# Patient Record
Sex: Male | Born: 1995 | Race: White | Hispanic: No | Marital: Married | Smoking: Former smoker
Health system: Southern US, Community
[De-identification: ages and names within clinical notes are randomized; demographics above are authoritative.]

## PROBLEM LIST (undated history)

## (undated) DIAGNOSIS — F419 Anxiety disorder, unspecified: Secondary | ICD-10-CM

## (undated) DIAGNOSIS — G43909 Migraine, unspecified, not intractable, without status migrainosus: Secondary | ICD-10-CM

## (undated) HISTORY — DX: Migraine, unspecified, not intractable, without status migrainosus: G43.909

## (undated) HISTORY — PX: APPENDECTOMY: SHX54

## (undated) HISTORY — DX: Anxiety disorder, unspecified: F41.9

---
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
## 2013-02-16 ENCOUNTER — Telehealth: Payer: Self-pay | Admitting: Internal Medicine

## 2013-02-16 NOTE — Telephone Encounter (Signed)
Yes can use acute

## 2013-02-16 NOTE — Telephone Encounter (Signed)
Schedule any time 12/31

## 2013-02-16 NOTE — Telephone Encounter (Signed)
The patient has been scheduled on 12.31.14.

## 2013-02-16 NOTE — Telephone Encounter (Signed)
Only spots available before patient returns to school is 03/04/13 please advise if okay to schedule patient as acute.

## 2013-02-16 NOTE — Telephone Encounter (Signed)
The patient is needing to be seen while he is out for the Christmas break. He is having a lot of stress and sleep issues . Please advise as to where to put the patient . He will return to school on 1.2.14.

## 2013-03-02 ENCOUNTER — Emergency Department: Payer: Self-pay | Admitting: Emergency Medicine

## 2013-03-02 LAB — COMPREHENSIVE METABOLIC PANEL
Alkaline Phosphatase: 94 U/L
Bilirubin,Total: 0.3 mg/dL (ref 0.2–1.0)
Calcium, Total: 9 mg/dL (ref 9.0–10.7)
Chloride: 104 mmol/L (ref 98–107)
Creatinine: 0.87 mg/dL (ref 0.60–1.30)
EGFR (African American): >60
EGFR (Non-African Amer.): >60
Glucose: 157 mg/dL — ABNORMAL HIGH (ref 65–99)
SGOT(AST): 16 U/L (ref 10–41)
SGPT (ALT): 26 U/L (ref 12–78)

## 2013-03-02 LAB — URINALYSIS, COMPLETE
Bilirubin,UR: NEGATIVE
Blood: NEGATIVE
Glucose,UR: NEGATIVE mg/dL (ref 0–75)
Ketone: NEGATIVE
Nitrite: NEGATIVE
Ph: 5 (ref 4.5–8.0)
RBC,UR: 1 /HPF (ref 0–5)
Specific Gravity: 1.028 (ref 1.003–1.030)
WBC UR: 1 /HPF (ref 0–5)

## 2013-03-02 LAB — CBC WITH DIFFERENTIAL/PLATELET
Basophil %: 0.4 %
Eosinophil #: 0 10*3/uL (ref 0.0–0.7)
Eosinophil %: 0.6 %
HCT: 46.2 % (ref 40.0–52.0)
HGB: 15.6 g/dL (ref 13.0–18.0)
MCH: 30 pg (ref 26.0–34.0)
MCHC: 33.9 g/dL (ref 32.0–36.0)
MCV: 89 fL (ref 80–100)
Monocyte #: 0.4 x10 3/mm (ref 0.2–1.0)
Monocyte %: 5.7 %
RBC: 5.21 10*6/uL (ref 4.40–5.90)
WBC: 6.7 10*3/uL (ref 3.8–10.6)

## 2013-03-02 LAB — LIPASE, BLOOD: Lipase: 93 U/L (ref 73–393)

## 2013-03-04 ENCOUNTER — Ambulatory Visit (INDEPENDENT_AMBULATORY_CARE_PROVIDER_SITE_OTHER): Payer: BC Managed Care – PPO | Admitting: Internal Medicine

## 2013-03-04 ENCOUNTER — Encounter: Payer: Self-pay | Admitting: Internal Medicine

## 2013-03-04 VITALS — BP 120/70 | HR 82 | Temp 97.9°F | Resp 12 | Wt 155.0 lb

## 2013-03-04 DIAGNOSIS — F411 Generalized anxiety disorder: Secondary | ICD-10-CM

## 2013-03-04 DIAGNOSIS — K5909 Other constipation: Secondary | ICD-10-CM

## 2013-03-04 DIAGNOSIS — K5903 Drug induced constipation: Secondary | ICD-10-CM

## 2013-03-04 DIAGNOSIS — T50904A Poisoning by unspecified drugs, medicaments and biological substances, undetermined, initial encounter: Secondary | ICD-10-CM

## 2013-03-04 DIAGNOSIS — F419 Anxiety disorder, unspecified: Secondary | ICD-10-CM

## 2013-03-04 MED ORDER — BUSPIRONE HCL 7.5 MG PO TABS: 7.5000 mg | ORAL_TABLET | Freq: Three times a day (TID) | ORAL | Status: DC

## 2013-03-04 NOTE — Patient Instructions (Signed)
I am prescribing buspirone for your anxiety.  It is taken up to 3 times,  Daily every 8 hours.  We can increase the dose after a week if you feel it is not helping   Stop the lactulose and try the linzess samples once daily if needed for constipation  The elavil and the sleep aid are probably causing the constipation   I recommend that you have the school rrefer you for a counselor to help manage your anxiety

## 2013-03-04 NOTE — Progress Notes (Signed)
Pre visit review using our clinic review tool, if applicable. No additional management support is needed unless otherwise documented below in the visit note. 

## 2013-03-04 NOTE — Progress Notes (Signed)
Patient ID: Ryan Woods, male   DOB: 09-24-1995, 17 y.o.   MRN: [id number]    Patient Active Problem List   Diagnosis Date Noted  . Drug-induced constipation 03/05/2013  . Routine general medical examination at a health care facility 08/09/2012  . Migraine 04/07/2012  . Need for prophylactic vaccination with combined diphtheria-tetanus-pertussis (DTP) vaccine 04/07/2012  . Need for prophylactic vaccination against hepatitis A 04/07/2012  . Back pain, lumbosacral 04/07/2012  . Anxiety 04/07/2012    Subjective:  CC:   Chief Complaint  Patient presents with  . Anxiety    pt states cant sleep wakes average 6 times, falls asleep around 1 AM then wakes up usually around 10 AM / bad anxiety the last month due to  finance / school. pt has loss of apeptite, denies suicidal thoughts or hurting others.     HPI:   Ryan Woods a 17 y.o. male who presents with Uncontrolled anxiety and worsening insomnia. He is accompanied by his mother Kenney Houseman.  Having constant anxiety,  Worrying all the time, using an OTC sleep aid every night. Aggravating factors include recent transition from living at home to matriculation to culinary school in [place], mother's declining health.   , Had an epiosde of abdominal pain  Which resulted in ER visit and CT scan notable for only constipation.  Has been taking elavil at bedtime for chronic headache prevention and has been using a sleep aid which contains dipenhydramine.  He has noted that his normally loose bowels have recently become hard and infrequent . In the ER he was given liquid lactulose and bentyl . He has been using lactulose once daily and the bentyl 4 times daily and has been  .   Averaging 2 bowel movements daily.     Past Medical History  Diagnosis Date  . Migraine     History reviewed. No pertinent past surgical history.     The following portions of the patient's history were reviewed and updated as appropriate: Allergies,  current medications, and problem list.    Review of Systems:   12 Pt  review of systems was negative except those addressed in the HPI,     History   Social History  . Marital Status: Single    Spouse Name: N/A    Number of Children: N/A  . Years of Education: N/A   Occupational History  . Not on file.   Social History Main Topics  . Smoking status: Light Tobacco Smoker  . Smokeless tobacco: Never Used  . Alcohol Use: No  . Drug Use: No  . Sexual Activity: No   Other Topics Concern  . Not on file   Social History Narrative  . No narrative on file    Objective:  Filed Vitals:   03/04/13 0850  BP: 120/70  Pulse: 82  Temp: 97.9 F (36.6 C)  Resp: 12     General appearance: alert, cooperative and appears stated age Ears: normal TM's and external ear canals both ears Throat: lips, mucosa, and tongue normal; teeth and gums normal Neck: no adenopathy, no carotid bruit, supple, symmetrical, trachea midline and thyroid not enlarged, symmetric, no tenderness/mass/nodules Back: symmetric, no curvature. ROM normal. No CVA tenderness. Lungs: clear to auscultation bilaterally Heart: regular rate and rhythm, S1, S2 normal, no murmur, click, rub or gallop Abdomen: soft, non-tender; bowel sounds normal; no masses,  no organomegaly Pulses: 2+ and symmetric Skin: Skin color, texture, turgor normal. No rashes or lesions Lymph nodes: Cervical, supraclavicular, and  axillary nodes normal. Psych: anxious, makes good eye contact  Assessment and Plan:  Anxiety trial of buspirone  Low dose, and strong recommendations to start psychotherapy in [place] on campus.   Drug-induced constipation Secondary to use of elavil and benadryl.  Advised to stop the benadryl ,  Trial of linzess,  Samples given.   A total of 30 minutes of face to face time was spent with patient more than half of which was spent in counselling and coordination of care    Updated Medication  List Outpatient Encounter Prescriptions as of 03/04/2013  Medication Sig  . amitriptyline (ELAVIL) 25 MG tablet Take 1 tablet (25 mg total) by mouth at bedtime.  . dicyclomine (BENTYL) 20 MG tablet   . lactulose (CHRONULAC) 10 GM/15ML solution   . busPIRone (BUSPAR) 7.5 MG tablet Take 1 tablet (7.5 mg total) by mouth 3 (three) times daily.     No orders of the defined types were placed in this encounter.    No Follow-up on file.

## 2013-03-05 ENCOUNTER — Encounter: Payer: Self-pay | Admitting: Internal Medicine

## 2013-03-05 DIAGNOSIS — K5903 Drug induced constipation: Secondary | ICD-10-CM | POA: Insufficient documentation

## 2013-03-05 NOTE — Assessment & Plan Note (Signed)
Secondary to use of elavil and benadryl.  Advised to stop the benadryl ,  Trial of linzess,  Samples given.

## 2013-03-05 NOTE — Assessment & Plan Note (Signed)
trial of buspirone  Low dose, and strong recommendations to start psychotherapy in [place] on campus.

## 2013-04-20 ENCOUNTER — Other Ambulatory Visit: Payer: Self-pay | Admitting: Internal Medicine

## 2013-06-08 ENCOUNTER — Telehealth: Payer: Self-pay | Admitting: Internal Medicine

## 2013-06-08 ENCOUNTER — Telehealth: Payer: Self-pay | Admitting: *Deleted

## 2013-06-08 MED ORDER — BUSPIRONE HCL 7.5 MG PO TABS: 7.5000 mg | ORAL_TABLET | Freq: Three times a day (TID) | ORAL | Status: DC

## 2013-06-08 NOTE — Telephone Encounter (Signed)
States [facility] in [place] faxed request for buspar refill several times.  States they have not gotten a response.  Pt is in college in [place] and needs the script sent there.  States he has been out and is having difficulty with anxiety.    [facility] [place] Fax:  [phone]  Phone:  [phone]  States he needs the script immediately.  Asking for call when complete. States to call father, Cordie GriceLeonard Reep [phone] so that he can text him that the script has been called in as pt will be in class.

## 2013-06-08 NOTE — Telephone Encounter (Signed)
Refill sent as requested. 

## 2013-09-30 ENCOUNTER — Encounter: Payer: Self-pay | Admitting: Internal Medicine

## 2013-09-30 ENCOUNTER — Ambulatory Visit (INDEPENDENT_AMBULATORY_CARE_PROVIDER_SITE_OTHER): Payer: BC Managed Care – PPO | Admitting: Internal Medicine

## 2013-09-30 VITALS — BP 130/78 | HR 68 | Temp 98.5°F | Resp 16 | Ht 71.0 in | Wt 151.5 lb

## 2013-09-30 DIAGNOSIS — F411 Generalized anxiety disorder: Secondary | ICD-10-CM

## 2013-09-30 DIAGNOSIS — F419 Anxiety disorder, unspecified: Secondary | ICD-10-CM

## 2013-09-30 MED ORDER — SERTRALINE HCL 50 MG PO TABS: 50.0000 mg | ORAL_TABLET | Freq: Every day | ORAL | Status: DC

## 2013-09-30 MED ORDER — BUSPIRONE HCL 7.5 MG PO TABS: 7.5000 mg | ORAL_TABLET | Freq: Three times a day (TID) | ORAL | Status: DC

## 2013-09-30 NOTE — Patient Instructions (Signed)
We are Starting generic zoloft at 1/2 tablet daily with a  meal for the  first 4 to 6 days,   Increase to full tablet when tolerating 1/2 tablet   After two weeks of 50 mg daily,  If still anxious increase to 100 mg daily  E mail me at that point  [facility]  As needed for anxiety

## 2013-09-30 NOTE — Progress Notes (Signed)
Pre-visit discussion using our clinic review tool. No additional management support is needed unless otherwise documented below in the visit note.  

## 2013-10-02 NOTE — Assessment & Plan Note (Signed)
Adding generic zoloft.  Dose ot be titrated up prior to returning to school in september. continue buspirone

## 2013-10-02 NOTE — Progress Notes (Signed)
Patient ID: Ryan Woods, male   DOB: 07-Jan-1996, 18 y.o.   MRN: [id number]   Patient Active Problem List   Diagnosis Date Noted  . Drug-induced constipation 03/05/2013  . Routine general medical examination at a health care facility 08/09/2012  . Migraine 04/07/2012  . Need for prophylactic vaccination with combined diphtheria-tetanus-pertussis (DTP) vaccine 04/07/2012  . Need for prophylactic vaccination against hepatitis A 04/07/2012  . Back pain, lumbosacral 04/07/2012  . Anxiety 04/07/2012    Subjective:  CC:   Chief Complaint  Patient presents with  . Follow-up    to discuss medications    HPI:   Ryan GilmoreChristopher Polson is a 18 y.o. male who presents for Increased anxiety.  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  No suicidality or destrive behaviors.  Self described compulsive personality   Past Medical History  Diagnosis Date  . Migraine     No past surgical history on file.     The following portions of the patient's history were reviewed and updated as appropriate: Allergies, current medications, and problem list.    Review of Systems:   Patient denies headache, fevers, malaise, unintentional weight loss, skin rash, eye pain, sinus congestion and sinus pain, sore throat, dysphagia,  hemoptysis , cough, dyspnea, wheezing, chest pain, palpitations, orthopnea, edema, abdominal pain, nausea, melena, diarrhea, constipation, flank pain, dysuria, hematuria, urinary  Frequency, nocturia, numbness, tingling, seizures,  Focal weakness, Loss of consciousness,  Tremor, insomnia, depression, anxiety, and suicidal ideation.     History   Social History  . Marital Status: Single    Spouse Name: N/A    Number of Children: N/A  . Years of Education: N/A   Occupational History  .  Not on file.   Social History Main Topics  . Smoking status: Light Tobacco Smoker  . Smokeless tobacco: Never Used  . Alcohol Use: No  . Drug Use: No  . Sexual Activity: No   Other Topics Concern  . Not on file   Social History Narrative  . No narrative on file    Objective:  Filed Vitals:   09/30/13 1135  BP: 130/78  Pulse: 68  Temp: 98.5 F (36.9 C)  Resp: 16     General appearance: alert, cooperative and appears stated age Ears: normal TM's and external ear canals both ears Throat: lips, mucosa, and tongue normal; teeth and gums normal Neck: no adenopathy, no carotid bruit, supple, symmetrical, trachea midline and thyroid not enlarged, symmetric, no tenderness/mass/nodules Back: symmetric, no curvature. ROM normal. No CVA tenderness. Lungs: clear to auscultation bilaterally Heart: regular rate and rhythm, S1, S2 normal, no murmur, click, rub or gallop Abdomen: soft, non-tender; bowel sounds normal; no masses,  no organomegaly Pulses: 2+ and symmetric Skin: Skin color, texture, turgor normal. No rashes or lesions Lymph nodes: Cervical, supraclavicular, and axillary nodes normal.  Assessment and Plan:  Anxiety Adding generic zoloft.  Dose ot be titrated up prior to returning to school in september. continue buspirone   A total of 25 minutes of face to face time was spent with patient more than half of which was spent in counselling and coordination of care   Updated Medication List Outpatient Encounter Prescriptions as of 09/30/2013  Medication Sig  . busPIRone (BUSPAR) 7.5 MG tablet Take 1 tablet (7.5 mg total)  by mouth 3 (three) times daily.  . [DISCONTINUED] busPIRone (BUSPAR) 7.5 MG tablet Take 1 tablet (7.5 mg total) by mouth 3 (three) times daily.  Marland Kitchen amitriptyline (ELAVIL) 25 MG tablet TAKE 1 TABLET BY MOUTH AT BEDTIME (NEED UPDATED INSURANCE)  . dicyclomine (BENTYL) 20 MG tablet   . lactulose (CHRONULAC) 10 GM/15ML solution   . sertraline (ZOLOFT) 50 MG  tablet Take 1 tablet (50 mg total) by mouth daily.     No orders of the defined types were placed in this encounter.    Return in about 3 months (around 12/31/2013).

## 2014-01-23 ENCOUNTER — Other Ambulatory Visit: Payer: Self-pay | Admitting: Internal Medicine

## 2014-02-24 ENCOUNTER — Other Ambulatory Visit: Payer: Self-pay | Admitting: Internal Medicine

## 2014-02-24 NOTE — Telephone Encounter (Signed)
Okay to refill? Last seen in July 2015

## 2014-03-01 NOTE — Telephone Encounter (Signed)
Refill one 30 days and offer 6 month follow up on anxiety

## 2014-03-02 NOTE — Telephone Encounter (Signed)
Tried to reach patient by phone no answer or voicemail. Mailed letter to patient to schedule follow up appointment.

## 2014-08-10 IMAGING — CT CT ABD-PELV W/ CM
2 of 4 series · 16 of 46 positions shown, 18 images · IV contrast (agent unspecified)
Comparison: None.

CLINICAL DATA: Left lower quadrant pain, chest pain, and right
lower quadrant pain.

EXAM:
CT ABDOMEN AND PELVIS WITH CONTRAST
TECHNIQUE: Multidetector CT imaging of the abdomen and pelvis was performed
using the standard protocol following bolus administration of
intravenous contrast.
CONTRAST:  100 mL Fsovue-AUU

[Series 2: routine abd pel with · axial · 0.68mm/px · z∈[-492,-82]mm · 13 of 90 slices shown, 15 images]
[im 4/90  soft-tissue]
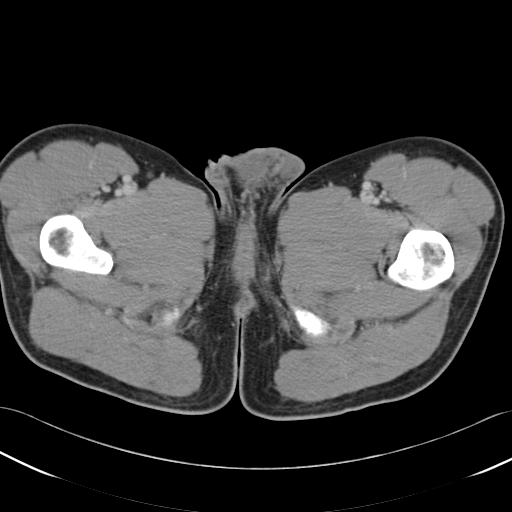
[im 4/90  bone]
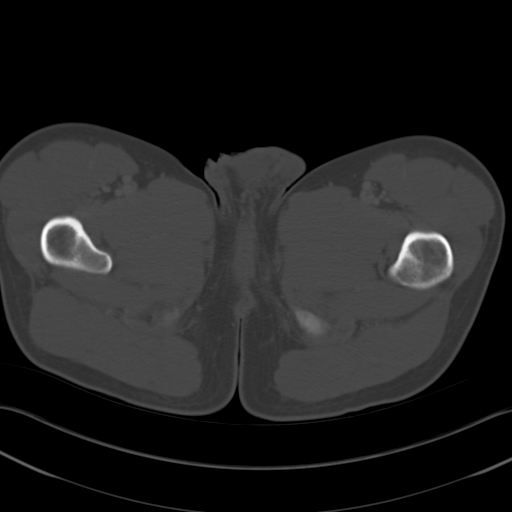
[im 12/90  soft-tissue]
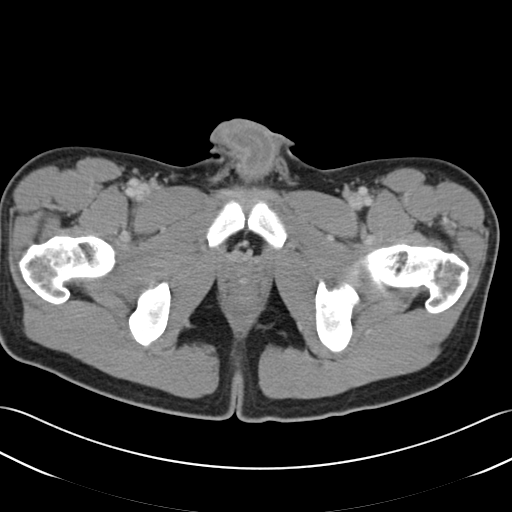
[im 19/90  soft-tissue]
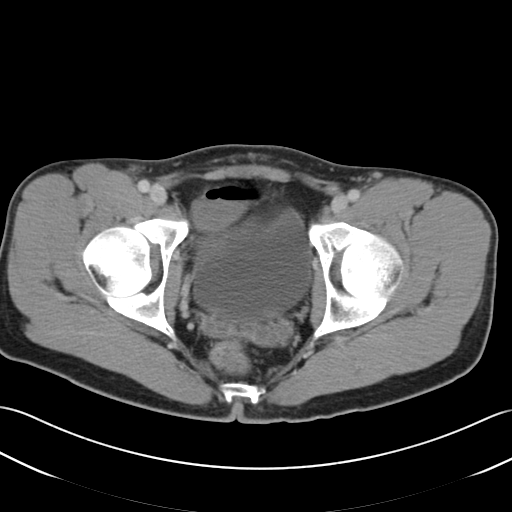
[im 26/90  soft-tissue]
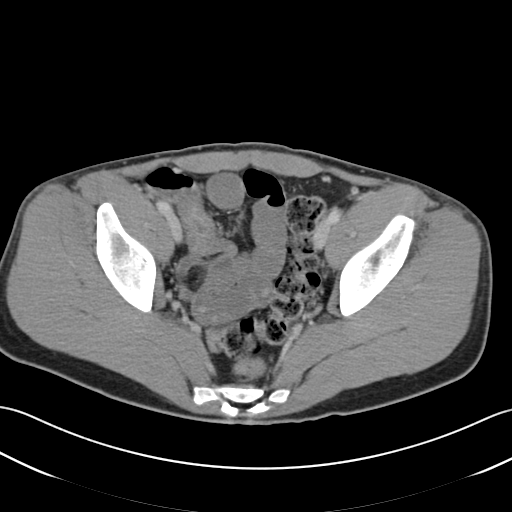
[im 30/90  soft-tissue]
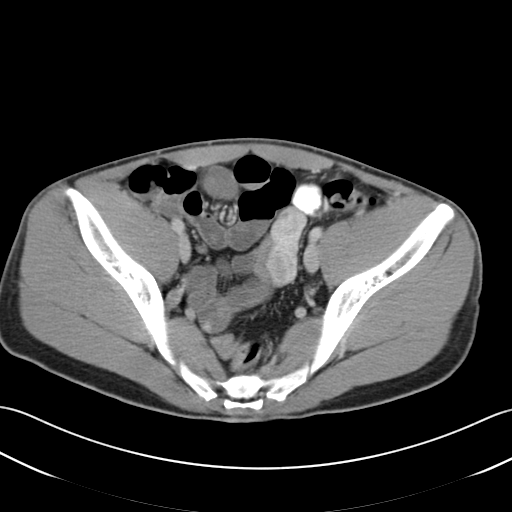
[im 38/90  soft-tissue]
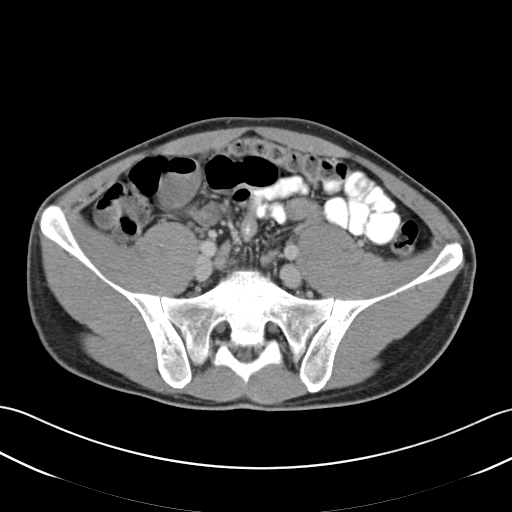
[im 45/90  soft-tissue]
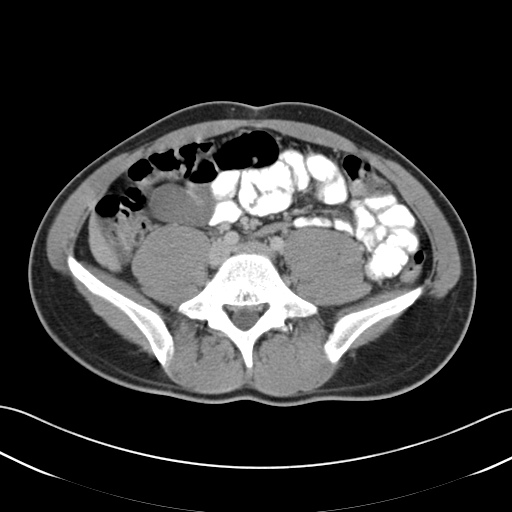
[im 52/90  soft-tissue]
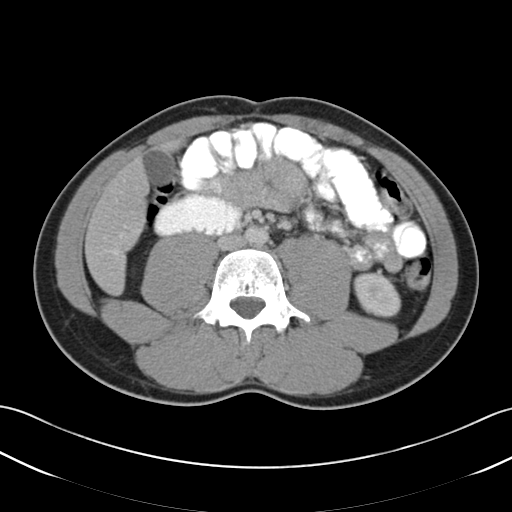
[im 60/90  soft-tissue]
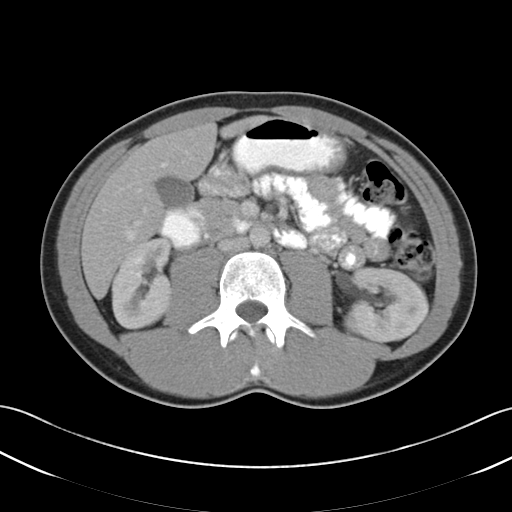
[im 60/90  bone]
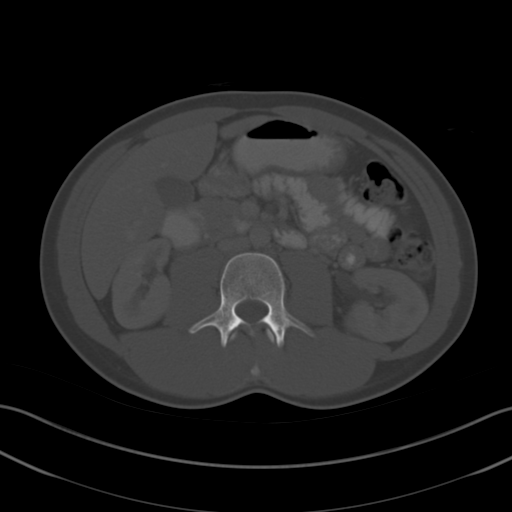
[im 64/90  soft-tissue]
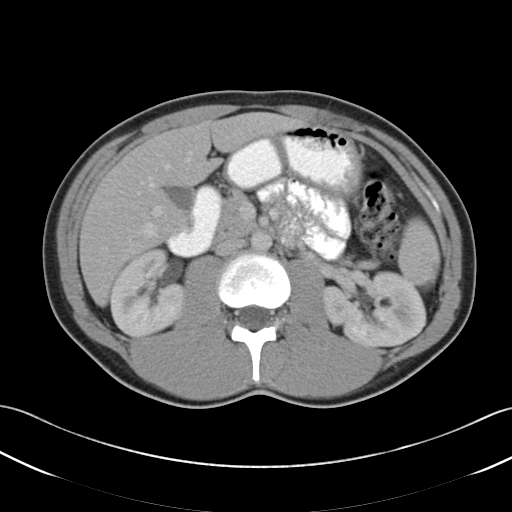
[im 71/90  soft-tissue]
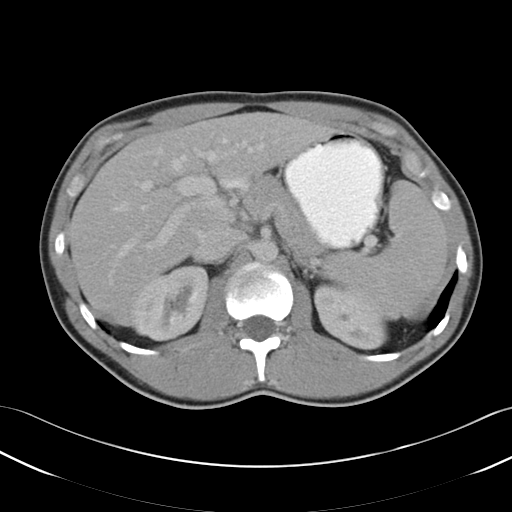
[im 78/90  soft-tissue]
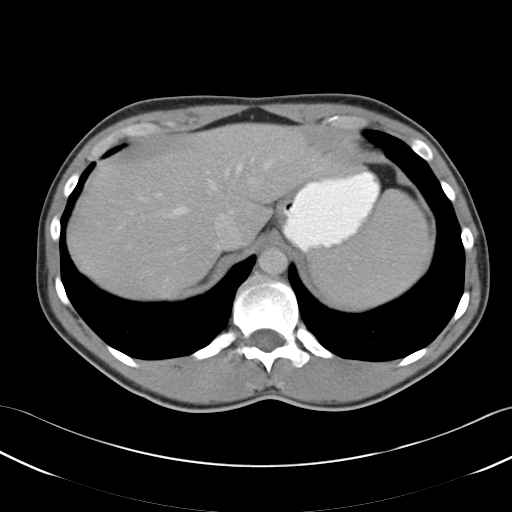
[im 86/90  soft-tissue]
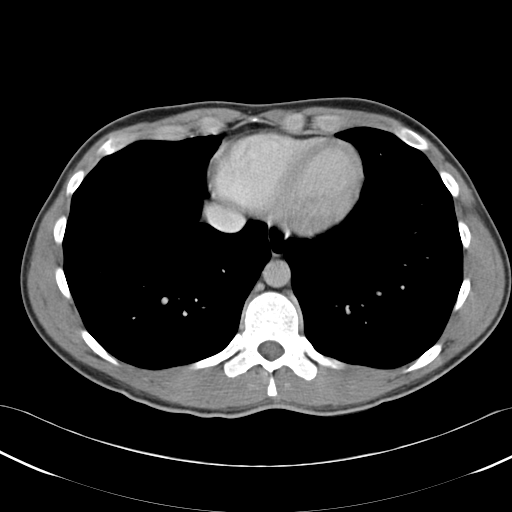

[Series 5: cor routine abd pel with · coronal · 0.61mm/px · 3 of 100 slices shown]
[im 34/100  soft-tissue]
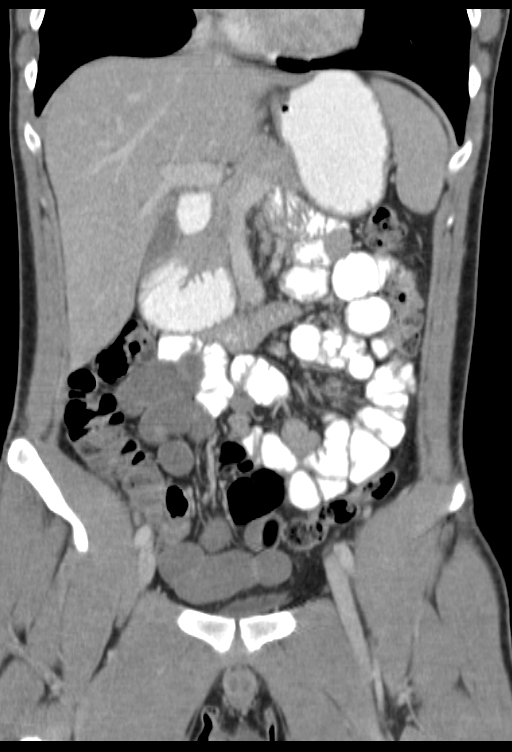
[im 45/100  soft-tissue]
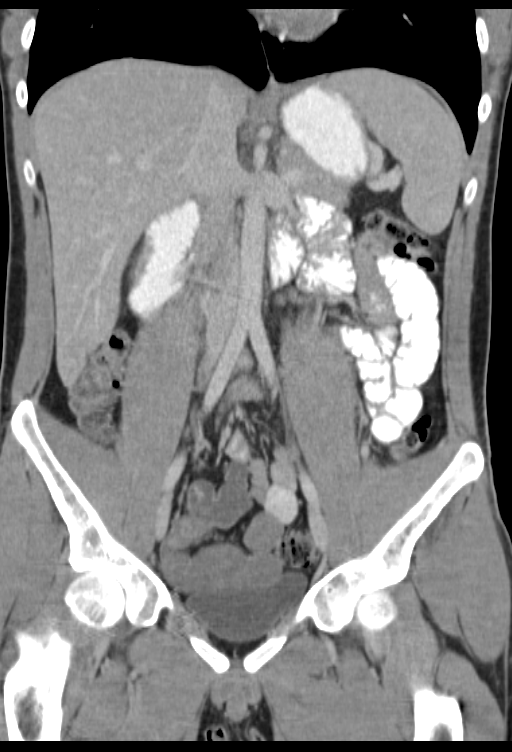
[im 56/100  soft-tissue]
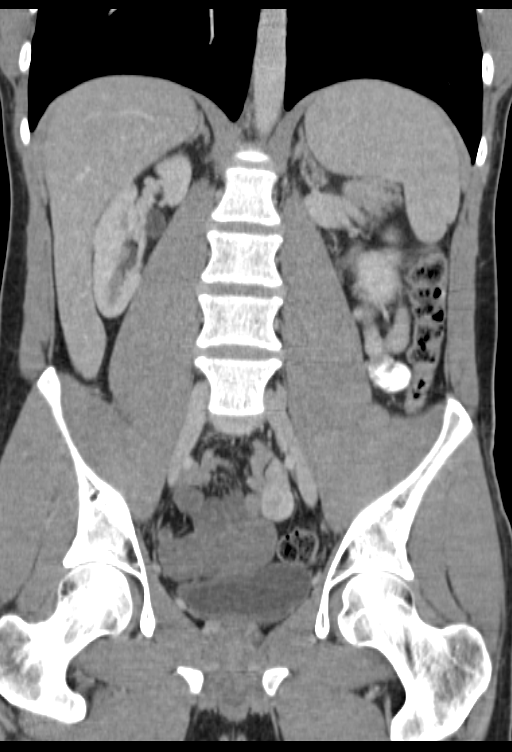

[16 of 46 positions shown; findings below may reference images not displayed]

FINDINGS: Lung bases are clear.

The liver, spleen, gallbladder, pancreas, adrenal glands, kidneys,
abdominal aorta, inferior vena cava, and retroperitoneal lymph nodes
are unremarkable. The stomach, small bowel, and colon are not
abnormally distended. No free air or free fluid in the abdomen.

Pelvis: Prostate gland is not enlarged. Bladder wall is not
thickened. Stool-filled rectosigmoid colon without diverticulitis.
The appendix is normal. No free or loculated pelvic fluid
collections. No destructive bone lesions.
IMPRESSION: No acute process demonstrated in the abdomen or pelvis. The appendix
appears normal.

## 2018-04-11 ENCOUNTER — Other Ambulatory Visit: Payer: Self-pay

## 2018-04-11 ENCOUNTER — Ambulatory Visit
Admission: EM | Admit: 2018-04-11 | Discharge: 2018-04-11 | Disposition: A | Discharge status: Home / Self Care | Payer: BLUE CROSS/BLUE SHIELD | Attending: Emergency Medicine | Admitting: Emergency Medicine

## 2018-04-11 ENCOUNTER — Ambulatory Visit (INDEPENDENT_AMBULATORY_CARE_PROVIDER_SITE_OTHER): Payer: BLUE CROSS/BLUE SHIELD

## 2018-04-11 DIAGNOSIS — M791 Myalgia, unspecified site: Secondary | ICD-10-CM

## 2018-04-11 DIAGNOSIS — R509 Fever, unspecified: Secondary | ICD-10-CM

## 2018-04-11 DIAGNOSIS — R0981 Nasal congestion: Secondary | ICD-10-CM

## 2018-04-11 DIAGNOSIS — R69 Illness, unspecified: Secondary | ICD-10-CM

## 2018-04-11 DIAGNOSIS — R05 Cough: Secondary | ICD-10-CM | POA: Diagnosis not present

## 2018-04-11 DIAGNOSIS — J111 Influenza due to unidentified influenza virus with other respiratory manifestations: Secondary | ICD-10-CM

## 2018-04-11 DIAGNOSIS — Z87891 Personal history of nicotine dependence: Secondary | ICD-10-CM

## 2018-04-11 MED ORDER — HYDROCOD POLST-CPM POLST ER 10-8 MG/5ML PO SUER: 5.0000 mL | Freq: Every evening | ORAL | 0 refills | Status: DC | PRN

## 2018-04-11 MED ORDER — ACETAMINOPHEN 500 MG PO TABS
1000.0000 mg | ORAL_TABLET | Freq: Once | ORAL | Status: AC
  Administered 2018-04-11: 1000 mg via ORAL

## 2018-04-11 MED ORDER — BENZONATATE 100 MG PO CAPS: 100.0000 mg | ORAL_CAPSULE | Freq: Three times a day (TID) | ORAL | 0 refills | Status: DC | PRN

## 2018-04-11 NOTE — ED Provider Notes (Signed)
[facility] ____________________________________________  Time seen: Approximately 9:50 AM  I have reviewed the triage vital signs and the nursing notes.   HISTORY  Chief Complaint Generalized Body Aches   HPI Ryan Woods is a 23 y.o. male presenting for evaluation of cough, congestion, chills and body aches with accompanying fever since Tuesday morning.  States has been taken over-the-counter cough and congestion medication that had Tylenol in it.  States has taken occasional ibuprofen in addition.  States feels tired and fatigued.  Reports quick onset of symptoms.  Denies known direct sick contacts.  Does work at [employer] around a lot of people.  Has not taken any over-the-counter medications for the same complaints.  Denies chest pain abdominal pain.  Does report some shortness of breath.  States cough is occasionally productive.  Cough causes headache.  Cough disrupting sleep.  Overall continues to drink fluids well.  Denies other relieving factors.    Sherlene Shams, MD: PCP    Past Medical History:  Diagnosis Date  . Migraine     Patient Active Problem List   Diagnosis Date Noted  . Drug-induced constipation 03/05/2013  . Routine general medical examination at a health care facility 08/09/2012  . Migraine 04/07/2012  . Need for prophylactic vaccination with combined diphtheria-tetanus-pertussis (DTP) vaccine 04/07/2012  . Need for prophylactic vaccination against hepatitis A 04/07/2012  . Back pain, lumbosacral 04/07/2012  . Anxiety 04/07/2012    Past Surgical History:  Procedure Laterality Date  . APPENDECTOMY       No current facility-administered medications for this encounter.   Current Outpatient Medications:  .  benzonatate (TESSALON PERLES) 100 MG capsule, Take 1 capsule (100 mg total) by mouth 3 (three) times daily as needed for cough., Disp: 15 capsule, Rfl: 0 .  chlorpheniramine-HYDROcodone (TUSSIONEX PENNKINETIC ER) 10-8 MG/5ML  SUER, Take 5 mLs by mouth at bedtime as needed for cough. do not drive or operate machinery while taking as can cause drowsiness., Disp: 50 mL, Rfl: 0  Allergies Patient has no known allergies.  Family History  Problem Relation Age of Onset  . Heart disease Mother   . Hyperlipidemia Father   . Hypertension Father   . Arthritis Father   . Thyroid disease Father   . Cancer Maternal Grandmother        throat  . Heart disease Maternal Grandfather   . Thyroid disease Paternal Grandmother   . Heart disease Paternal Grandfather     Social History Social History   Tobacco Use  . Smoking status: Former Games developer  . Smokeless tobacco: Never Used  Substance Use Topics  . Alcohol use: Yes    Comment: occasionally  . Drug use: No    Review of Systems Constitutional: Positive fevers ENT: Some sore throat.  Denies current sore throat.  Positive nasal congestion. Cardiovascular: Denies chest pain. Respiratory: Denies shortness of breath. Gastrointestinal: No abdominal pain.  No nausea, no vomiting.  No diarrhea.   Musculoskeletal: Generalized body aches. Skin: Negative for rash. Neurological: Negative for focal weakness or numbness.  ____________________________________________   PHYSICAL EXAM:  VITAL SIGNS: ED Triage Vitals  Enc Vitals Group     BP 04/11/18 0905 128/82     Pulse Rate 04/11/18 0905 (!) 112     Resp 04/11/18 0905 18     Temp 04/11/18 0905 (!) 100.9 F (38.3 C)     Temp Source 04/11/18 0905 Oral     SpO2 04/11/18 0905 100 %  Weight 04/11/18 0901 155 lb (70.3 kg)     Height 04/11/18 0901 6' (1.829 m)     Head Circumference --      Peak Flow --      Pain Score 04/11/18 0901 9     Pain Loc --      Pain Edu? --      Excl. in GC? --    Constitutional: Alert and oriented. Well appearing and in no acute distress. Eyes: Conjunctivae are normal.  Head: Atraumatic. No sinus tenderness to palpation. No swelling. No erythema.  Ears: no erythema, normal TMs  bilaterally.   Nose:Nasal congestion   Mouth/Throat: Mucous membranes are moist. No pharyngeal erythema. No tonsillar swelling or exudate.  Neck: No stridor.  No cervical spine tenderness to palpation. Hematological/Lymphatic/Immunilogical: No cervical lymphadenopathy. Cardiovascular: Normal rate, regular rhythm. Grossly normal heart sounds.  Good peripheral circulation. Respiratory: Normal respiratory effort.  No retractions. No wheezes.  Mild scattered rhonchi. Good air movement.  Musculoskeletal: Ambulatory with steady gait. No cervical, thoracic or lumbar tenderness to palpation. Neurologic:  Normal speech and language. No gait instability. Skin:  Skin appears warm, dry and intact. No rash noted. Psychiatric: Mood and affect are normal. Speech and behavior are normal. ___________________________________________   LABS (all labs ordered are listed, but only abnormal results are displayed)  Labs Reviewed - No data to display  RADIOLOGY  Dg Chest 2 View  Result Date: 04/11/2018 CLINICAL DATA:  Productive cough and fever for several days, initial encounter EXAM: CHEST - 2 VIEW COMPARISON:  None. FINDINGS: The heart size and mediastinal contours are within normal limits. Both lungs are clear. The visualized skeletal structures are unremarkable. IMPRESSION: No active cardiopulmonary disease. Electronically Signed   By: Alcide Clever M.D.   On: 04/11/2018 09:57   ____________________________________________   PROCEDURES Procedures    INITIAL IMPRESSION / ASSESSMENT AND PLAN / ED COURSE  Pertinent labs & imaging results that were available during my care of the patient were reviewed by me and considered in my medical decision making (see chart for details).  Overall well-appearing patient.  No acute distress.  Suspect influenza.  Is some intermittent shortness of breath and scattered rhonchi, chest x-ray evaluated, no active cardiopulmonary disease as per radiologist above, reviewed by  myself.  Suspect viral illness.  Past timeframe for Tamiflu.  Will treat with PRN Tessalon Perles, PRN Tussionex, over-the-counter Tylenol ibuprofen.  Rest, fluids.  Work note given.Discussed indication, risks and benefits of medications with patient.  1 g Tylenol given once in urgent care.  Discussed follow up with Primary care physician this week. Discussed follow up and return parameters including no resolution or any worsening concerns. Patient verbalized understanding and agreed to plan.   ____________________________________________   FINAL CLINICAL IMPRESSION(S) / ED DIAGNOSES  Final diagnoses:  Influenza-like illness     ED Discharge Orders         Ordered    chlorpheniramine-HYDROcodone (TUSSIONEX PENNKINETIC ER) 10-8 MG/5ML SUER  At bedtime PRN     04/11/18 1004    benzonatate (TESSALON PERLES) 100 MG capsule  3 times daily PRN     04/11/18 1004           Note: This dictation was prepared with Dragon dictation along with smaller phrase technology. Any transcriptional errors that result from this process are unintentional.         Renford Dills, NP 04/11/18 1029

## 2018-04-11 NOTE — ED Triage Notes (Signed)
Patient complains of cough, congestion, body aches, headache and fever that started on Tuesday and seemed to worsen yesterday.

## 2018-04-11 NOTE — Discharge Instructions (Signed)
Take medication as prescribed. Rest. Drink plenty of fluids.  Alternate over-the-counter Tylenol and ibuprofen as discussed. ° °Follow up with your primary care physician this week as needed. Return to Urgent care for new or worsening concerns.  ° °

## 2020-11-04 ENCOUNTER — Other Ambulatory Visit: Payer: Self-pay | Admitting: Obstetrics and Gynecology

## 2020-11-04 DIAGNOSIS — Z319 Encounter for procreative management, unspecified: Secondary | ICD-10-CM

## 2021-08-06 NOTE — Progress Notes (Unsigned)
   New Patient Office Visit  Subjective    Patient ID: Ryan Woods, male    DOB: 1995/04/17  Age: 26 y.o. MRN: [id number]  CC: No chief complaint on file.   HPI Ryan Woods presents for new patient visit to establish care.  Introduced to Designer, jewellery role and practice setting.  All questions answered.  Discussed provider/patient relationship and expectations.   Outpatient Encounter Medications as of 08/07/2021  Medication Sig   benzonatate (TESSALON PERLES) 100 MG capsule Take 1 capsule (100 mg total) by mouth 3 (three) times daily as needed for cough.   chlorpheniramine-HYDROcodone (TUSSIONEX PENNKINETIC ER) 10-8 MG/5ML SUER Take 5 mLs by mouth at bedtime as needed for cough. do not drive or operate machinery while taking as can cause drowsiness.   No facility-administered encounter medications on file as of 08/07/2021.    Past Medical History:  Diagnosis Date   Migraine     Past Surgical History:  Procedure Laterality Date   APPENDECTOMY      Family History  Problem Relation Age of Onset   Heart disease Mother    Hyperlipidemia Father    Hypertension Father    Arthritis Father    Thyroid disease Father    Cancer Maternal Grandmother        throat   Heart disease Maternal Grandfather    Thyroid disease Paternal Grandmother    Heart disease Paternal Grandfather     Social History   Socioeconomic History   Marital status: Single    Spouse name: Not on file   Number of children: Not on file   Years of education: Not on file   Highest education level: Not on file  Occupational History   Not on file  Tobacco Use   Smoking status: Former   Smokeless tobacco: Never  Vaping Use   Vaping Use: Never used  Substance and Sexual Activity   Alcohol use: Yes    Comment: occasionally   Drug use: No   Sexual activity: Never  Other Topics Concern   Not on file  Social History Narrative   Not on file   Social Determinants of Health   Financial  Resource Strain: Not on file  Food Insecurity: Not on file  Transportation Needs: Not on file  Physical Activity: Not on file  Stress: Not on file  Social Connections: Not on file  Intimate Partner Violence: Not on file    ROS      Objective    There were no vitals taken for this visit.  Physical Exam  {Labs (Optional):23779}    Assessment & Plan:   Problem List Items Addressed This Visit   None   No follow-ups on file.   Charyl Dancer, NP

## 2021-08-07 ENCOUNTER — Other Ambulatory Visit: Payer: Self-pay | Admitting: Family Medicine

## 2021-08-07 ENCOUNTER — Ambulatory Visit (INDEPENDENT_AMBULATORY_CARE_PROVIDER_SITE_OTHER): Payer: No Typology Code available for payment source | Admitting: Nurse Practitioner

## 2021-08-07 ENCOUNTER — Encounter: Payer: Self-pay | Admitting: Nurse Practitioner

## 2021-08-07 VITALS — BP 150/80 | HR 77 | Temp 98.6°F | Ht 71.75 in | Wt 182.0 lb

## 2021-08-07 DIAGNOSIS — R7301 Impaired fasting glucose: Secondary | ICD-10-CM | POA: Diagnosis not present

## 2021-08-07 DIAGNOSIS — Z3141 Encounter for fertility testing: Secondary | ICD-10-CM

## 2021-08-07 DIAGNOSIS — Z13220 Encounter for screening for lipoid disorders: Secondary | ICD-10-CM

## 2021-08-07 DIAGNOSIS — I861 Scrotal varices: Secondary | ICD-10-CM

## 2021-08-07 DIAGNOSIS — F419 Anxiety disorder, unspecified: Secondary | ICD-10-CM

## 2021-08-07 DIAGNOSIS — R030 Elevated blood-pressure reading, without diagnosis of hypertension: Secondary | ICD-10-CM

## 2021-08-07 NOTE — Progress Notes (Signed)
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 Needs semen analysis.

## 2021-08-07 NOTE — Assessment & Plan Note (Signed)
He has been checking his blood sugars at home and they range from 60s-140s. Will check A1c today. Discussed eating regularly throughout the day instead of one big meal at night.

## 2021-08-07 NOTE — Assessment & Plan Note (Addendum)
He has a history of anxiety. He was taking zoloft and buspar in the past, but he didn't like the way they made him feel. His PHQ 9 is a 6 and his GAD 7 is an 11. He does not want to start any medications today, however he is interested in therapy. Referral placed for therapy. Will check TSH today.

## 2021-08-07 NOTE — Assessment & Plan Note (Signed)
He has been having left testicular discomfort, aching, and heaviness for the last year and a half. Veins in his left testicle are swollen on exam, most consistent with varicocele. He and his wife has also been having trouble with pregnancy for the last 2 years. Her workup thus far is negative, he has not had any testing done. Will place referral to urology. Discussed using ibuprofen and tylenol as needed for pain. He can also wear supportive underwear.

## 2021-08-07 NOTE — Assessment & Plan Note (Addendum)
Blood pressure elevated today at 150/80. He has not had high blood pressure before. Discussed limiting salt in his diet and increasing exercise. Recommend he start checking his blood pressure a few days a week at home and writing it down. Check CMP, CBC today. Follow-up in 4 weeks.

## 2021-08-07 NOTE — Patient Instructions (Signed)
It was great to see you!  Start checking your blood pressure at home a few days a week and write it down.   I am going to put in a referral to urology and to therapy for you.   Let's follow-up in 4 weeks, sooner if you have concerns.  If a referral was placed today, you will be contacted for an appointment. Please note that routine referrals can sometimes take up to 3-4 weeks to process. Please call our office if you haven't heard anything after this time frame.  Take care,  Rodman Pickle, NP

## 2021-08-08 LAB — COMPREHENSIVE METABOLIC PANEL
ALT: 19 U/L (ref 0–53)
AST: 14 U/L (ref 0–37)
Albumin: 4.8 g/dL (ref 3.5–5.2)
Alkaline Phosphatase: 68 U/L (ref 39–117)
BUN: 17 mg/dL (ref 6–23)
CO2: 26 mEq/L (ref 19–32)
Calcium: 9.5 mg/dL (ref 8.4–10.5)
Chloride: 103 mEq/L (ref 96–112)
Creatinine, Ser: 0.84 mg/dL (ref 0.40–1.50)
GFR: 119.4 mL/min (ref >60.00)
Glucose, Bld: 92 mg/dL (ref 70–99)
Potassium: 4 mEq/L (ref 3.5–5.1)
Sodium: 140 mEq/L (ref 135–145)
Total Bilirubin: 0.3 mg/dL (ref 0.2–1.2)
Total Protein: 7.4 g/dL (ref 6.0–8.3)

## 2021-08-08 LAB — LIPID PANEL
Cholesterol: 142 mg/dL (ref 0–200)
HDL: 47.5 mg/dL (ref >39.00)
LDL Cholesterol: 81 mg/dL (ref 0–99)
NonHDL: 94.64
Total CHOL/HDL Ratio: 3
Triglycerides: 66 mg/dL (ref 0.0–149.0)
VLDL: 13.2 mg/dL (ref 0.0–40.0)

## 2021-08-08 LAB — CBC WITH DIFFERENTIAL/PLATELET
Basophils Absolute: 0 10*3/uL (ref 0.0–0.1)
Basophils Relative: 0.8 % (ref 0.0–3.0)
Eosinophils Absolute: 0 10*3/uL (ref 0.0–0.7)
Eosinophils Relative: 0.5 % (ref 0.0–5.0)
HCT: 43.4 % (ref 39.0–52.0)
Hemoglobin: 14.5 g/dL (ref 13.0–17.0)
Lymphocytes Relative: 24.8 % (ref 12.0–46.0)
Lymphs Abs: 1.3 10*3/uL (ref 0.7–4.0)
MCHC: 33.5 g/dL (ref 30.0–36.0)
MCV: 88.3 fl (ref 78.0–100.0)
Monocytes Absolute: 0.4 10*3/uL (ref 0.1–1.0)
Monocytes Relative: 6.6 % (ref 3.0–12.0)
Neutro Abs: 3.6 10*3/uL (ref 1.4–7.7)
Neutrophils Relative %: 67.3 % (ref 43.0–77.0)
Platelets: 222 10*3/uL (ref 150.0–400.0)
RBC: 4.91 Mil/uL (ref 4.22–5.81)
RDW: 13.1 % (ref 11.5–15.5)
WBC: 5.4 10*3/uL (ref 4.0–10.5)

## 2021-08-08 LAB — TSH: TSH: 3.42 u[IU]/mL (ref 0.35–5.50)

## 2021-08-08 LAB — HEMOGLOBIN A1C: Hgb A1c MFr Bld: 5.5 % (ref 4.6–6.5)

## 2021-08-10 ENCOUNTER — Other Ambulatory Visit: Payer: Self-pay | Admitting: Family Medicine

## 2021-08-11 LAB — SEMEN ANALYSIS, BASIC
Appearance: NORMAL
Concentration, Sperm: 7.7 x10E6/mL — ABNORMAL LOW (ref >14.9)
Immotile Sperm: 40 %
Leukocyte Concentration: <1 x10E6/mL (ref <1.00)
Non-Progressive (NP): 43 %
Normal Morphology-Strict: 4 % (ref >3)
Progressive Motility (PR): 17 % — ABNORMAL LOW (ref >31)
Progressively Motile Sperm: 2.2 x10E6
Time Collected: 1522
Time Received: 1540
Time Since Last Emission: 9 days
Total Motile Sperm: 7.8 x10E6
Total Motility (PR+NP): 60 % (ref >39)
Total Sperm in Ejaculate: 13.1 x10E6 — ABNORMAL LOW (ref >38.9)
Volume: 1.7 mL (ref >1.4)
pH: 8.5 (ref >7.1)

## 2021-08-17 ENCOUNTER — Other Ambulatory Visit: Payer: Self-pay | Admitting: Urology

## 2021-08-17 DIAGNOSIS — N50812 Left testicular pain: Secondary | ICD-10-CM

## 2021-08-23 ENCOUNTER — Ambulatory Visit
Admission: RE | Admit: 2021-08-23 | Discharge: 2021-08-23 | Disposition: A | Discharge status: Home / Self Care | Payer: No Typology Code available for payment source | Source: Ambulatory Visit | Attending: Urology | Admitting: Urology

## 2021-08-23 DIAGNOSIS — N50812 Left testicular pain: Secondary | ICD-10-CM | POA: Diagnosis present

## 2021-09-04 ENCOUNTER — Ambulatory Visit: Payer: No Typology Code available for payment source | Admitting: Nurse Practitioner

## 2021-10-05 ENCOUNTER — Ambulatory Visit: Payer: Self-pay | Admitting: Internal Medicine

## 2022-01-22 ENCOUNTER — Encounter: Payer: Self-pay | Admitting: Nurse Practitioner

## 2022-04-10 NOTE — Progress Notes (Unsigned)
   Established Patient Office Visit  Subjective   Patient ID: Ryan Woods, male    DOB: 1995/10/06  Age: 27 y.o. MRN: [id number]  No chief complaint on file.   HPI  Ryan Woods is here to follow-up on elevated blood pressure.   {History (Optional):23778}  ROS    Objective:     There were no vitals taken for this visit. {Vitals History (Optional):23777}  Physical Exam   No results found for any visits on 04/11/22.  {Labs (Optional):23779}  The ASCVD Risk score (Arnett DK, et al., 2019) failed to calculate for the following reasons:   The 2019 ASCVD risk score is only valid for ages 62 to 60    Assessment & Plan:   Problem List Items Addressed This Visit   None   No follow-ups on file.    Charyl Dancer, NP

## 2022-04-11 ENCOUNTER — Ambulatory Visit (INDEPENDENT_AMBULATORY_CARE_PROVIDER_SITE_OTHER): Payer: No Typology Code available for payment source | Admitting: Nurse Practitioner

## 2022-04-11 ENCOUNTER — Encounter: Payer: Self-pay | Admitting: Nurse Practitioner

## 2022-04-11 VITALS — BP 145/88 | HR 79 | Temp 98.2°F | Wt 182.0 lb

## 2022-04-11 DIAGNOSIS — I10 Essential (primary) hypertension: Secondary | ICD-10-CM | POA: Insufficient documentation

## 2022-04-11 DIAGNOSIS — Z23 Encounter for immunization: Secondary | ICD-10-CM

## 2022-04-11 DIAGNOSIS — F32A Depression, unspecified: Secondary | ICD-10-CM

## 2022-04-11 DIAGNOSIS — F419 Anxiety disorder, unspecified: Secondary | ICD-10-CM | POA: Diagnosis not present

## 2022-04-11 MED ORDER — HYDROXYZINE PAMOATE 25 MG PO CAPS: 25.0000 mg | ORAL_CAPSULE | Freq: Three times a day (TID) | ORAL | 0 refills | Status: DC | PRN

## 2022-04-11 MED ORDER — LISINOPRIL 10 MG PO TABS: 10.0000 mg | ORAL_TABLET | Freq: Every day | ORAL | 1 refills | Status: DC

## 2022-04-11 MED ORDER — SERTRALINE HCL 25 MG PO TABS: 25.0000 mg | ORAL_TABLET | Freq: Every day | ORAL | 1 refills | Status: DC

## 2022-04-11 NOTE — Assessment & Plan Note (Signed)
Blood pressure is elevated at 145/88 today. His blood pressure at home over the last few months has ranged from 140s-160s/80s. Will have him start lisinopril 10mg  daily. Discussed limiting salt in his diet. Follow-up in 2 weeks.

## 2022-04-11 NOTE — Assessment & Plan Note (Addendum)
Chronic, not controlled. His symptoms have gotten worse since his sister passed away 6 weeks ago. He has been having panic attacks 2x per week. He endorses some thoughts of suicidal ideation, however denies a plan. He states that he would not want to leave his wife. His PHQ 9 is a 22 and his GAD 7 is a 19. Will have him start zoloft 25mg  daily and hydroxyzine 25mg  as needed for panic attacks. Referral placed to therapy. He is interested in a location close to his home in [place], [place]. Discussed resources for immediate mental health help and going to ER if needed. Follow-up in 2 weeks.

## 2022-04-11 NOTE — Patient Instructions (Signed)
It was great to see you!  Start zoloft 1 tablet daily for you depression/anxiety. You can also start hydroxyzine 1 capsule as needed for panic attack, this may make you sleepy.   Start lisinopril 1 tablet daily for your blood pressure. Keep checking your blood pressure at home.   Let's follow-up in 2-3 weeks, sooner if you have concerns.  If a referral was placed today, you will be contacted for an appointment. Please note that routine referrals can sometimes take up to 3-4 weeks to process. Please call our office if you haven't heard anything after this time frame.  Take care,  Vance Peper, NP

## 2022-04-20 ENCOUNTER — Ambulatory Visit: Payer: No Typology Code available for payment source | Admitting: Nurse Practitioner

## 2022-05-02 ENCOUNTER — Encounter: Payer: Self-pay | Admitting: Nurse Practitioner

## 2022-05-02 ENCOUNTER — Ambulatory Visit (INDEPENDENT_AMBULATORY_CARE_PROVIDER_SITE_OTHER): Payer: No Typology Code available for payment source | Admitting: Nurse Practitioner

## 2022-05-02 VITALS — BP 138/84 | HR 69 | Temp 98.6°F | Ht 71.75 in | Wt 184.6 lb

## 2022-05-02 DIAGNOSIS — F32A Depression, unspecified: Secondary | ICD-10-CM | POA: Diagnosis not present

## 2022-05-02 DIAGNOSIS — I10 Essential (primary) hypertension: Secondary | ICD-10-CM | POA: Diagnosis not present

## 2022-05-02 DIAGNOSIS — F419 Anxiety disorder, unspecified: Secondary | ICD-10-CM

## 2022-05-02 MED ORDER — SERTRALINE HCL 50 MG PO TABS: 50.0000 mg | ORAL_TABLET | Freq: Every day | ORAL | 1 refills | Status: DC

## 2022-05-02 MED ORDER — LISINOPRIL 20 MG PO TABS: 20.0000 mg | ORAL_TABLET | Freq: Every day | ORAL | 3 refills | Status: DC

## 2022-05-02 NOTE — Assessment & Plan Note (Signed)
Chronic, improving.  BP today 138/84 which is consistent with his readings at home.  Will increase his lisinopril to 20 mg daily.  Check BMP today.  Follow-up in 4 to 6 weeks.

## 2022-05-02 NOTE — Assessment & Plan Note (Signed)
Chronic, improving.  His depression and anxiety have improved slightly since starting the sertraline and he is not having any side effects to this.  His PHQ-9 has gone from a 22 to a 12 and his GAD-7 has gone from a 19 to an 8.  He still has some thoughts of SI, however no plan.  His wife is here today and has noticed an improvement in his mood as well.  He heard from a psychiatry office he was referred to, however they do not do therapy only.  Will reach out to our referral coordinator about scheduling him with a therapist.  Will increase sertraline to 50 mg daily.  Follow-up in 4 to 6 weeks.

## 2022-05-02 NOTE — Progress Notes (Signed)
Established Patient Office Visit  Subjective   Patient ID: Ryan Woods, male    DOB: 05-04-1995  Age: 27 y.o. MRN: [id number]  Chief Complaint  Patient presents with   Depression    Concerns with Vistaril lower B/P    HPI  Ryan Woods is here to follow-up on depression, anxiety, and hypertension.  Last visit he was stared on lisinopril '10mg'$  daily for his blood pressure.  He has been checking his blood pressure at home and it has been 134-138/80s.  He is not having any side effects to the medication.  He said that he had some slight chest discomfort yesterday with anxiety and he has intermittent shortness of breath with the anxiety as well.    He was started on sertraline '25mg'$  daily and hydroxyzine '25mg'$  TID prn panic attack. He was also referred to a therapist.  He states that his depression anxiety have slightly improved.  He is still having some thoughts of SI, however still no plan.  His wife is with him today who has noticed improvement in his mood as well.  He has only needed to take the hydroxyzine 3 times since his last visit.  He has noticed that when he takes the hydroxyzine, his blood pressure is in a more normal range the next morning.     05/02/2022    1:30 PM 04/11/2022    9:02 AM 04/11/2022    8:21 AM 08/07/2021    3:33 PM  Depression screen PHQ 2/9  Decreased Interest 2 3 0 2  Down, Depressed, Hopeless '2 3 3 2  '$ PHQ - 2 Score '4 6 3 4  '$ Altered sleeping 0 3  0  Tired, decreased energy '1 2  2  '$ Change in appetite 0 3  0  Feeling bad or failure about yourself  3 3  0  Trouble concentrating 1 2  0  Moving slowly or fidgety/restless 1 1  0  Suicidal thoughts 2 2  0  PHQ-9 Score '12 22  6  '$ Difficult doing work/chores Somewhat difficult Very difficult  Not difficult at all      05/02/2022    1:31 PM 04/11/2022    9:03 AM 08/07/2021    3:34 PM  GAD 7 : Generalized Anxiety Score  Nervous, Anxious, on Edge '1 3 3  '$ Control/stop worrying '2 3 1  '$ Worry too much -  different things '2 3 2  '$ Trouble relaxing '2 2 1  '$ Restless 0 3 2  Easily annoyed or irritable 0 2 1  Afraid - awful might happen '1 3 1  '$ Total GAD 7 Score '8 19 11  '$ Anxiety Difficulty Somewhat difficult Very difficult Somewhat difficult      ROS See pertinent positives and negatives per HPI.    Objective:     BP 138/84 (BP Location: Right Arm)   Pulse 69   Temp 98.6 F (37 C)   Ht 5' 11.75" (1.822 m)   Wt 184 lb 9.6 oz (83.7 kg)   SpO2 96%   BMI 25.21 kg/m    Physical Exam Vitals and nursing note reviewed.  Constitutional:      Appearance: Normal appearance.  HENT:     Head: Normocephalic.  Eyes:     Conjunctiva/sclera: Conjunctivae normal.  Cardiovascular:     Rate and Rhythm: Normal rate and regular rhythm.     Pulses: Normal pulses.     Heart sounds: Normal heart sounds.  Pulmonary:     Effort: Pulmonary effort  is normal.     Breath sounds: Normal breath sounds.  Musculoskeletal:     Cervical back: Normal range of motion.  Skin:    General: Skin is warm.  Neurological:     General: No focal deficit present.     Mental Status: He is alert and oriented to person, place, and time.  Psychiatric:        Mood and Affect: Mood normal.        Behavior: Behavior normal.        Thought Content: Thought content normal.        Judgment: Judgment normal.      Assessment & Plan:   Problem List Items Addressed This Visit       Cardiovascular and Mediastinum   Primary hypertension - Primary    Chronic, improving.  BP today 138/84 which is consistent with his readings at home.  Will increase his lisinopril to 20 mg daily.  Check BMP today.  Follow-up in 4 to 6 weeks.      Relevant Medications   lisinopril (ZESTRIL) 20 MG tablet   Other Relevant Orders   Basic metabolic panel     Other   Anxiety and depression    Chronic, improving.  His depression and anxiety have improved slightly since starting the sertraline and he is not having any side effects to this.   His PHQ-9 has gone from a 22 to a 12 and his GAD-7 has gone from a 19 to an 8.  He still has some thoughts of SI, however no plan.  His wife is here today and has noticed an improvement in his mood as well.  He heard from a psychiatry office he was referred to, however they do not do therapy only.  Will reach out to our referral coordinator about scheduling him with a therapist.  Will increase sertraline to 50 mg daily.  Follow-up in 4 to 6 weeks.      Relevant Medications   sertraline (ZOLOFT) 50 MG tablet    Return in about 4 weeks (around 05/30/2022) for 4-6 weeks , Anxiety, Depression, HTN.    Charyl Dancer, NP

## 2022-05-02 NOTE — Patient Instructions (Addendum)
It was great to see you!  Increase your lisinopril to '20mg'$  daily.   Increase your zoloft to '50mg'$  daily.   We are re-working on your therapist referral and someone will call.   We are checking your labs today and will let you know the results via mychart/phone.   Let's follow-up in 4-6 weeks, sooner if you have concerns.  If a referral was placed today, you will be contacted for an appointment. Please note that routine referrals can sometimes take up to 3-4 weeks to process. Please call our office if you haven't heard anything after this time frame.  Take care,  Vance Peper, NP

## 2022-05-03 LAB — BASIC METABOLIC PANEL
BUN: 27 mg/dL — ABNORMAL HIGH (ref 6–23)
CO2: 27 mEq/L (ref 19–32)
Calcium: 10 mg/dL (ref 8.4–10.5)
Chloride: 102 mEq/L (ref 96–112)
Creatinine, Ser: 0.94 mg/dL (ref 0.40–1.50)
GFR: 110.42 mL/min (ref >60.00)
Glucose, Bld: 66 mg/dL — ABNORMAL LOW (ref 70–99)
Potassium: 4.3 mEq/L (ref 3.5–5.1)
Sodium: 140 mEq/L (ref 135–145)

## 2022-05-28 ENCOUNTER — Telehealth: Payer: Self-pay | Admitting: Nurse Practitioner

## 2022-05-28 NOTE — Telephone Encounter (Signed)
Pt called requesting appt 3/27 3:00p be changed to VV. I have updated. Please notify pt if he needs to be in person instead.

## 2022-05-28 NOTE — Telephone Encounter (Signed)
I spoke with patient and discussed how the virtual visit goes and he is ok with this.

## 2022-05-30 ENCOUNTER — Encounter: Payer: Self-pay | Admitting: Nurse Practitioner

## 2022-05-30 ENCOUNTER — Telehealth: Payer: No Typology Code available for payment source | Admitting: Nurse Practitioner

## 2022-05-30 VITALS — BP 122/74 | Ht 71.75 in | Wt 190.0 lb

## 2022-05-30 DIAGNOSIS — I10 Essential (primary) hypertension: Secondary | ICD-10-CM | POA: Diagnosis not present

## 2022-05-30 DIAGNOSIS — F32A Depression, unspecified: Secondary | ICD-10-CM

## 2022-05-30 DIAGNOSIS — F419 Anxiety disorder, unspecified: Secondary | ICD-10-CM | POA: Diagnosis not present

## 2022-05-30 MED ORDER — SERTRALINE HCL 50 MG PO TABS: 50.0000 mg | ORAL_TABLET | Freq: Every day | ORAL | 1 refills | Status: DC

## 2022-05-30 NOTE — Patient Instructions (Signed)
It was great to see you!  Continue the current dose of your lisinopril and sertraline.   Let's follow-up in 3 months, sooner if you have concerns.  If a referral was placed today, you will be contacted for an appointment. Please note that routine referrals can sometimes take up to 3-4 weeks to process. Please call our office if you haven't heard anything after this time frame.  Take care,  Vance Peper, NP

## 2022-05-30 NOTE — Progress Notes (Signed)
[facility] [address] [place] [address] Dept: [phone] Dept Fax: [phone]  Virtual Video Visit  I connected with Ryan Woods on 05/31/22 at  3:00 PM EDT by a video enabled telemedicine application and verified that I am speaking with the correct person using two identifiers.  Location patient: Home Location provider: Clinic Persons participating in the virtual visit: Patient; Ryan Pickle, NP; Ryan Woods, CMA  I discussed the limitations of evaluation and management by telemedicine and the availability of in person appointments. The patient expressed understanding and agreed to proceed.  Chief Complaint  Patient presents with   Hypertension    No other concerns    SUBJECTIVE:  HPI: Ryan Woods is a 27 y.o. male who presents to follow-up on hypertension, depression and anxiety.  He states that his mood is stable. He is still experiencing stress at work, however he has had less panic attacks. He feels like the current dose of medication is working well and does not need to be adjusted at this time. He denies SI/HI.   He states that his blood pressure has improved since increasing the lisinopril to 20mg  daily. He denies chest pain and shortness of breath. His blood pressure has been running in the 110s-120s/70s.    Patient Active Problem List   Diagnosis Date Noted   Primary hypertension 04/11/2022   Varicocele 08/07/2021   IFG (impaired fasting glucose) 08/07/2021   Elevated blood pressure reading 08/07/2021   Routine general medical examination at a health care facility 08/09/2012   Migraine 04/07/2012   Need for prophylactic vaccination with combined diphtheria-tetanus-pertussis (DTP) vaccine 04/07/2012   Need for prophylactic vaccination against hepatitis A 04/07/2012   Back pain, lumbosacral 04/07/2012   Anxiety and depression 04/07/2012    Past Surgical History:   Procedure Laterality Date   APPENDECTOMY      Family History  Problem Relation Age of Onset   Heart disease Mother    Diabetes Father    Hyperlipidemia Father    Hypertension Father    Arthritis Father    Thyroid disease Father    Cancer Maternal Grandmother        throat   Cancer Maternal Grandfather        lung   Heart disease Maternal Grandfather    Thyroid disease Paternal Grandmother    Heart disease Paternal Grandfather     Social History   Tobacco Use   Smoking status: Former    Types: Cigarettes    Quit date: 2015    Years since quitting: 9.2   Smokeless tobacco: Never  Vaping Use   Vaping Use: Never used  Substance Use Topics   Alcohol use: Yes    Comment: occasionally   Drug use: No     Current Outpatient Medications:    hydrOXYzine (VISTARIL) 25 MG capsule, Take 1 capsule (25 mg total) by mouth every 8 (eight) hours as needed for anxiety., Disp: 30 capsule, Rfl: 0   lisinopril (ZESTRIL) 20 MG tablet, Take 1 tablet (20 mg total) by mouth daily., Disp: 90 tablet, Rfl: 3   sertraline (ZOLOFT) 50 MG tablet, Take 1 tablet (50 mg total) by mouth daily., Disp: 90 tablet, Rfl: 1  No Known Allergies  ROS: See pertinent positives and negatives per HPI.  OBSERVATIONS/OBJECTIVE:  VITALS per patient if applicable: Today's Vitals   05/30/22 1504  BP: 122/74  Weight: 190 lb (86.2 kg)  Height: 5' 11.75" (1.822 m)  Body mass index is 25.95 kg/m.    GENERAL: Alert and oriented. Appears well and in no acute distress.  HEENT: Atraumatic. Conjunctiva clear. No obvious abnormalities on inspection of external nose and ears.  NECK: Normal movements of the head and neck.  LUNGS: On inspection, no signs of respiratory distress. Breathing rate appears normal. No obvious gross SOB, gasping or wheezing, and no conversational dyspnea.  CV: No obvious cyanosis.  MS: Moves all visible extremities without noticeable abnormality.  PSYCH/NEURO: Pleasant and  cooperative. No obvious depression or anxiety. Speech and thought processing grossly intact.  ASSESSMENT AND PLAN:  Problem List Items Addressed This Visit       Cardiovascular and Mediastinum   Primary hypertension    Chronic, stable. Continue lisinopril 20mg  daily. Follow-up in 3 months.         Other   Anxiety and depression - Primary    Chronic, improving. He states that his mood has stabilized and his medications do not need to be adjusted. Continue sertraline 50mg  daily and hydroxyzine 25mg  TID prn anxiety. Follow-up in 3 months.       Relevant Medications   sertraline (ZOLOFT) 50 MG tablet     I discussed the assessment and treatment plan with the patient. The patient was provided an opportunity to ask questions and all were answered. The patient agreed with the plan and demonstrated an understanding of the instructions.   The patient was advised to call back or seek an in-person evaluation if the symptoms worsen or if the condition fails to improve as anticipated.   Gerre Scull, NP

## 2022-05-31 NOTE — Assessment & Plan Note (Signed)
Chronic, stable. Continue lisinopril 20mg  daily. Follow-up in 3 months.

## 2022-05-31 NOTE — Assessment & Plan Note (Signed)
Chronic, improving. He states that his mood has stabilized and his medications do not need to be adjusted. Continue sertraline 50mg  daily and hydroxyzine 25mg  TID prn anxiety. Follow-up in 3 months.

## 2022-12-31 ENCOUNTER — Other Ambulatory Visit: Payer: Self-pay | Admitting: Nurse Practitioner

## 2023-01-01 ENCOUNTER — Other Ambulatory Visit: Payer: Self-pay | Admitting: Nurse Practitioner

## 2023-01-01 NOTE — Telephone Encounter (Signed)
Requesting: hydrOXYzine Pamoate 25 MG Oral Capsule  Last Visit: 05/30/2022 Next Visit: No FOV Last Refill: 04/11/2022  Please Advise

## 2023-01-30 IMAGING — US US SCROTUM W/ DOPPLER COMPLETE
1 series · 14 of 25 positions shown · non-contrast
Comparison: None Available.

CLINICAL DATA: Chronic left testicular pain.

EXAM:
SCROTAL ULTRASOUND
DOPPLER ULTRASOUND OF THE TESTICLES
TECHNIQUE: Complete ultrasound examination of the testicles, epididymis, and
other scrotal structures was performed. Color and spectral Doppler
ultrasound were also utilized to evaluate blood flow to the
testicles.

[Series 1: us scrotum w/ doppler complete · 0.06mm/px · 14 of 75 slices shown]
[im 1/75]
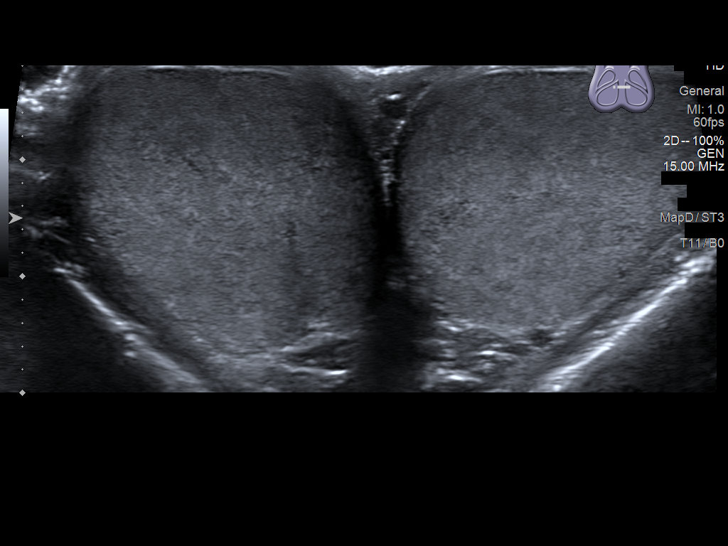
[im 7/75]
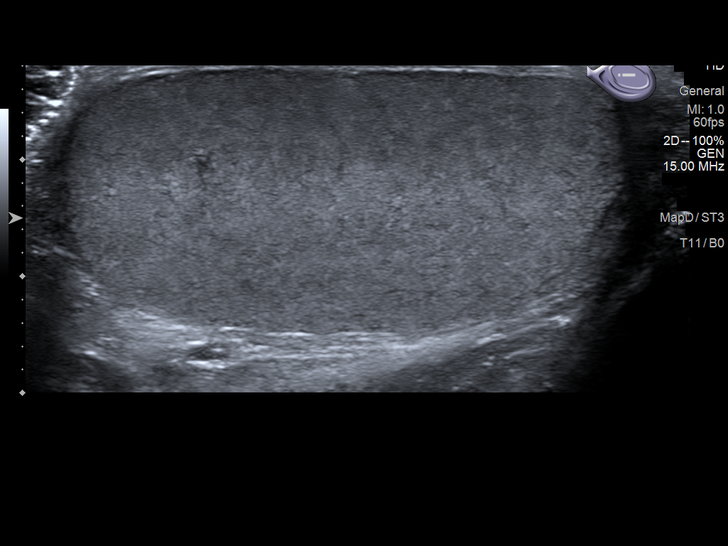
[im 13/75]
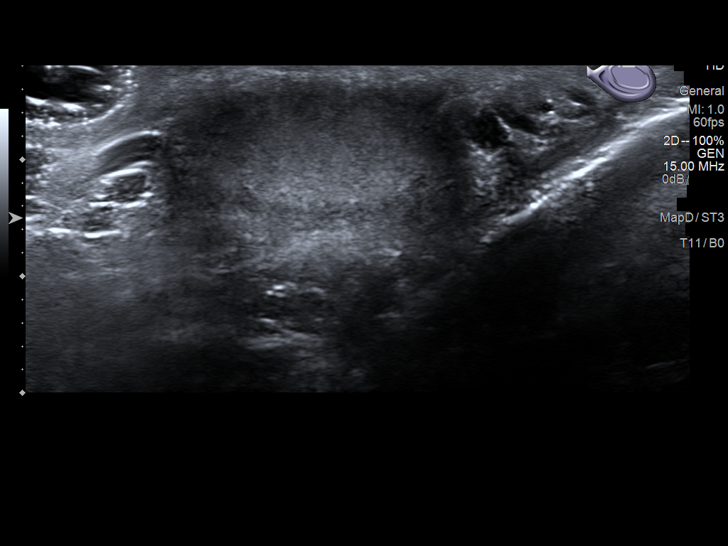
[im 19/75]
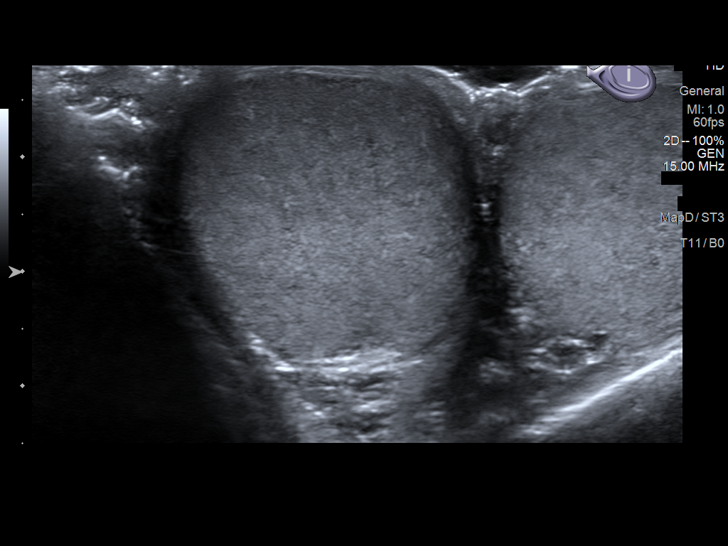
[im 25/75]
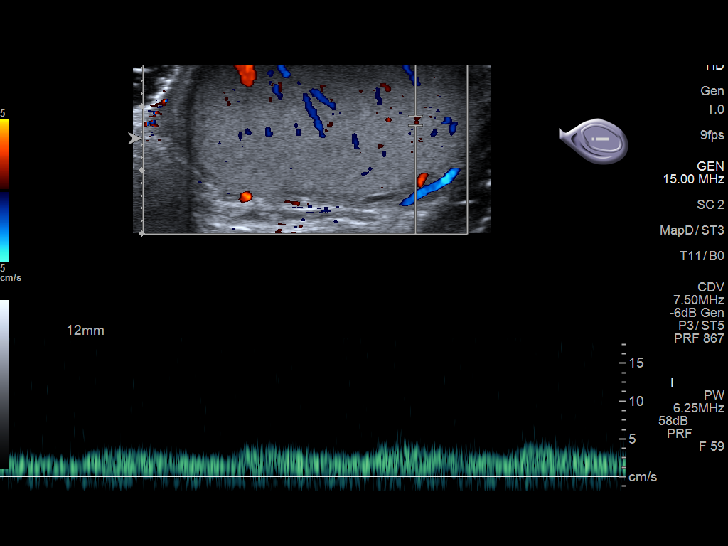
[im 28/75]
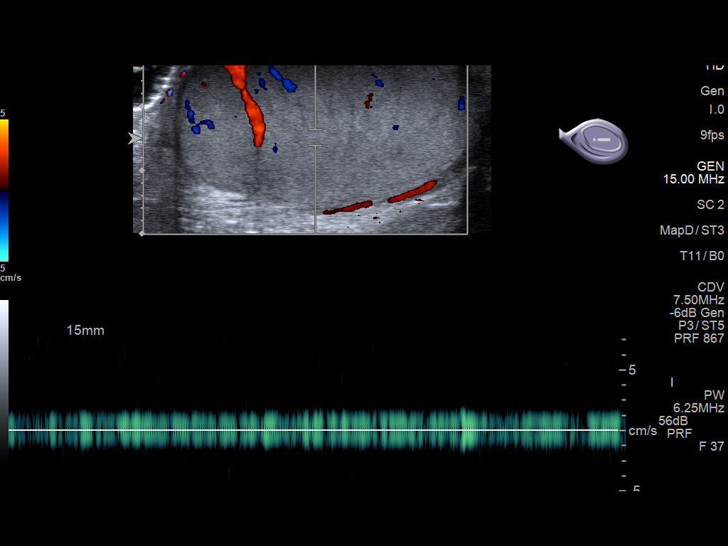
[im 34/75]
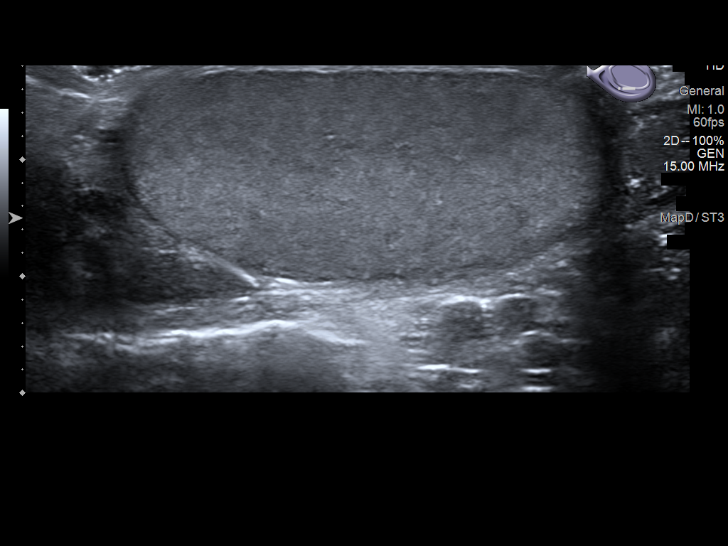
[im 41/75]
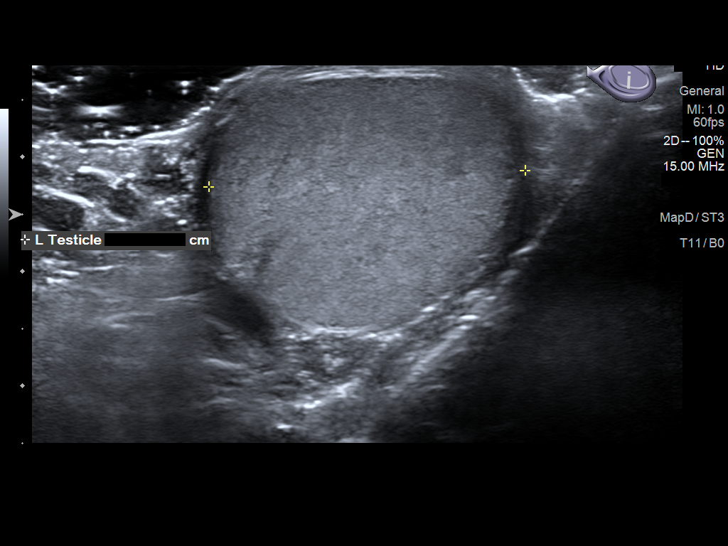
[im 47/75]
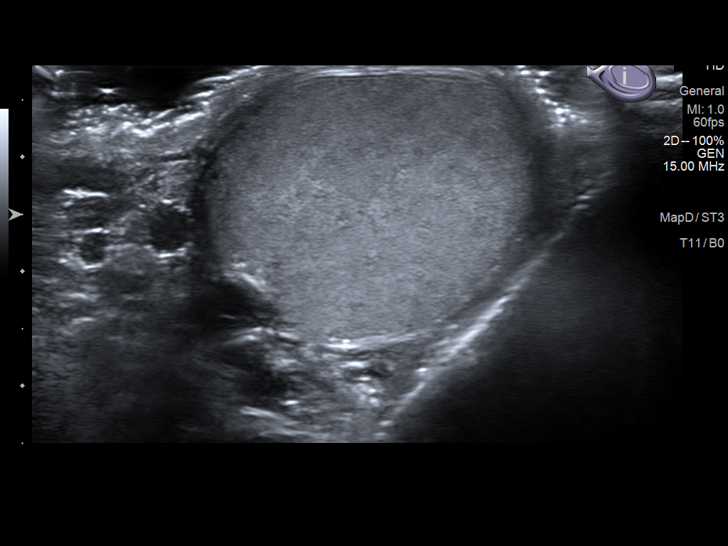
[im 50/75]
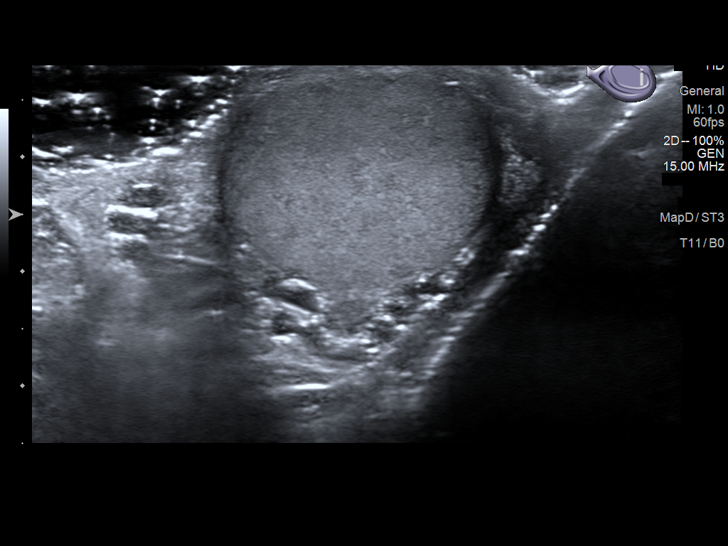
[im 56/75]
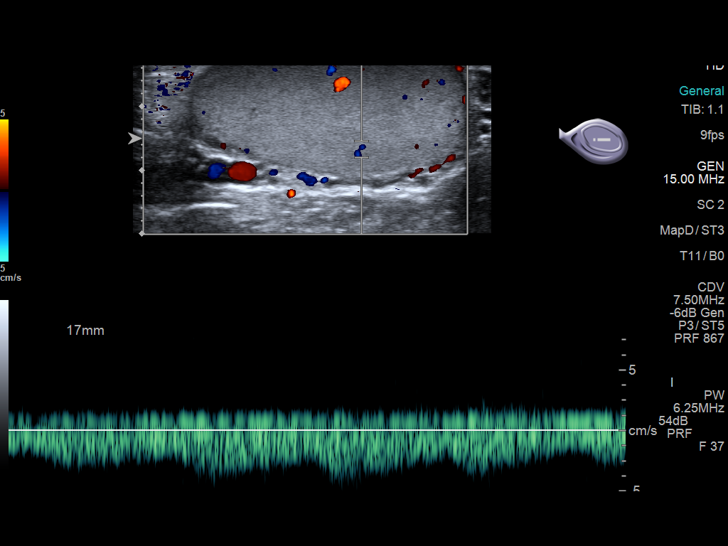
[im 62/75]
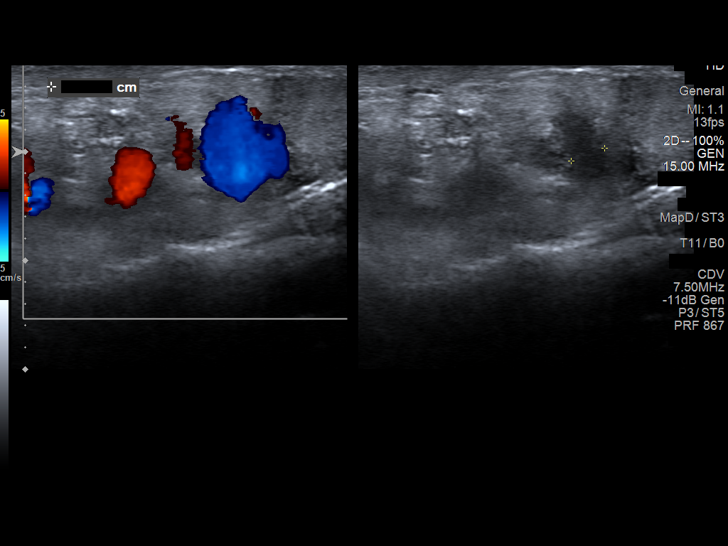
[im 68/75]
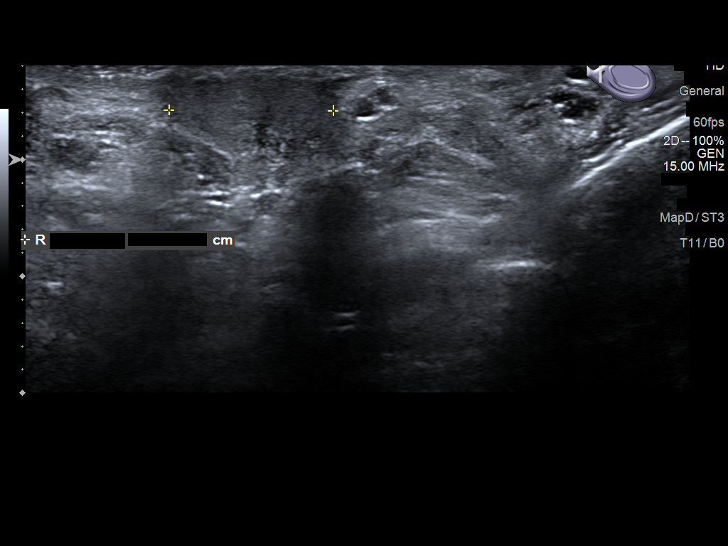
[im 75/75]
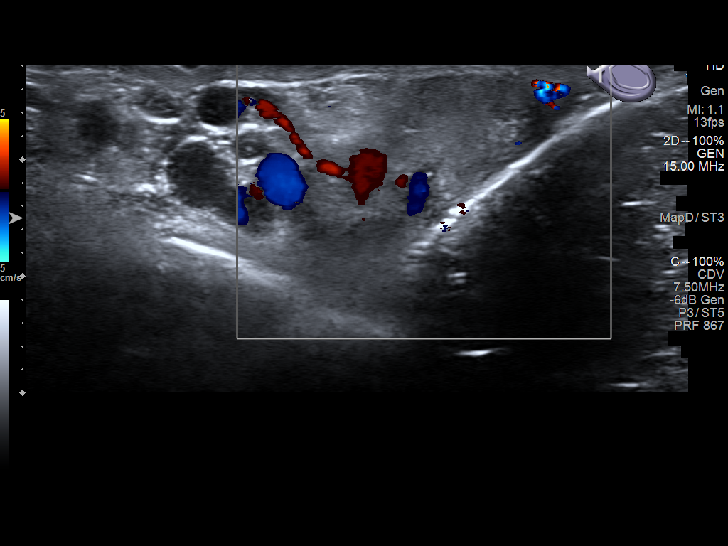

[14 of 25 positions shown; findings below may reference images not displayed]

FINDINGS: Right testicle

Measurements: 4.8 cm x 2.2 cm x 2.7 cm. No mass or microlithiasis
visualized.

Left testicle

Measurements: 4.7 cm x 1.8 cm x 2.8 cm. No mass or microlithiasis
visualized.

Right epididymis:  Normal in size and appearance.

Left epididymis:  Normal in size and appearance.

Hydrocele:  None visualized.

Varicocele:  A left-sided varicocele is noted.

Pulsed Doppler interrogation of both testes demonstrates normal low
resistance arterial and venous waveforms bilaterally.
IMPRESSION: Left-sided varicocele.

## 2023-03-21 ENCOUNTER — Ambulatory Visit: Payer: Self-pay | Admitting: Nurse Practitioner

## 2023-03-22 ENCOUNTER — Telehealth: Payer: Self-pay | Admitting: Nurse Practitioner

## 2023-03-22 MED ORDER — LISINOPRIL 20 MG PO TABS: 20.0000 mg | ORAL_TABLET | Freq: Every day | ORAL | 0 refills | Status: DC

## 2023-03-22 NOTE — Telephone Encounter (Signed)
Prescription Request  03/22/2023  LOV: 05/02/2022  What is the name of the medication or equipment? lisinopril (ZESTRIL) 20 MG tablet [[id number]]   Have you contacted your pharmacy to request a refill? No   Which pharmacy would you like this sent to?  [facility] [address], [place] - [address] [place] [address] Phone: [phone] Fax: [phone]    Patient notified that their request is being sent to the clinical staff for review and that they should receive a response within 2 business days.   Please advise at Mobile [phone] (mobile)

## 2023-03-22 NOTE — Telephone Encounter (Signed)
Per Cletis Athens for a 30 day supply of Lisinopril

## 2023-03-27 ENCOUNTER — Telehealth: Payer: Self-pay | Admitting: Nurse Practitioner

## 2023-03-27 ENCOUNTER — Ambulatory Visit (INDEPENDENT_AMBULATORY_CARE_PROVIDER_SITE_OTHER): Payer: Self-pay | Admitting: Internal Medicine

## 2023-03-27 ENCOUNTER — Encounter: Payer: Self-pay | Admitting: Internal Medicine

## 2023-03-27 VITALS — BP 108/70 | HR 74 | Temp 98.7°F | Ht 71.75 in | Wt 187.4 lb

## 2023-03-27 DIAGNOSIS — F32A Depression, unspecified: Secondary | ICD-10-CM

## 2023-03-27 DIAGNOSIS — I10 Essential (primary) hypertension: Secondary | ICD-10-CM

## 2023-03-27 DIAGNOSIS — F419 Anxiety disorder, unspecified: Secondary | ICD-10-CM

## 2023-03-27 MED ORDER — BUPROPION HCL 100 MG PO TABS: 100.0000 mg | ORAL_TABLET | Freq: Two times a day (BID) | ORAL | 1 refills | Status: DC

## 2023-03-27 MED ORDER — LISINOPRIL 20 MG PO TABS: 20.0000 mg | ORAL_TABLET | Freq: Every day | ORAL | 3 refills | Status: DC

## 2023-03-27 NOTE — Telephone Encounter (Signed)
03/21/2023 1st no show, pt seen today with Salvatore Decent, NP  Letter sent via [facility]

## 2023-03-27 NOTE — Progress Notes (Signed)
[facility] [address] [place] [address] Dept: [phone] Dept Fax: [phone]    Subjective:   Ryan Woods 03-24-1995 03/27/2023  Chief Complaint  Patient presents with   Medication Refill    Discuss blood pressure     HPI: Ryan Woods presents today for re-assessment and management of chronic medical conditions.  Discussed the use of AI scribe software for clinical note transcription with the patient, who gave verbal consent to proceed.  History of Present Illness   The patient, with a history of hypertension and depression, presents for a follow-up visit. He reports that his blood pressure has been well controlled on Lisinopril 20mg  daily, with home readings ranging from 115 to 125 systolic. This is a significant improvement from previous readings of 138 to 150 systolic. He denies any noticeable side effects from the medication. However, he does report occasional chest pain, which he attributes to anxiety rather than blood pressure fluctuations.  Regarding his depression, the patient has been off Zoloft since June due to sexual side effects. He reports a period of strong depression, exacerbated by multiple recent losses in his family and stress at work. He expresses a lack of effective coping mechanisms and is open to trying a new medication as long as the side effects are manageable.       The following portions of the patient's history were reviewed and updated as appropriate: past medical history, past surgical history, family history, social history, allergies, medications, and problem list.   Patient Active Problem List   Diagnosis Date Noted   Primary hypertension 04/11/2022   Varicocele 08/07/2021   IFG (impaired fasting glucose) 08/07/2021   Elevated blood pressure reading 08/07/2021   Routine general medical examination at a health care facility 08/09/2012   Migraine 04/07/2012    Need for prophylactic vaccination with combined diphtheria-tetanus-pertussis (DTP) vaccine 04/07/2012   Need for prophylactic vaccination against hepatitis A 04/07/2012   Back pain, lumbosacral 04/07/2012   Anxiety and depression 04/07/2012   Past Medical History:  Diagnosis Date   Anxiety    Migraine    Past Surgical History:  Procedure Laterality Date   APPENDECTOMY     Family History  Problem Relation Age of Onset   Heart disease Mother    Diabetes Father    Hyperlipidemia Father    Hypertension Father    Arthritis Father    Thyroid disease Father    Cancer Maternal Grandmother        throat   Cancer Maternal Grandfather        lung   Heart disease Maternal Grandfather    Thyroid disease Paternal Grandmother    Heart disease Paternal Grandfather     Current Outpatient Medications:    buPROPion (WELLBUTRIN) 100 MG tablet, Take 1 tablet (100 mg total) by mouth 2 (two) times daily., Disp: 60 tablet, Rfl: 1   hydrOXYzine (VISTARIL) 25 MG capsule, TAKE 1 CAPSULE BY MOUTH EVERY 8 HOURS AS NEEDED FOR ANXIETY, Disp: 30 capsule, Rfl: 0   lisinopril (ZESTRIL) 20 MG tablet, Take 1 tablet (20 mg total) by mouth daily., Disp: 90 tablet, Rfl: 3 No Known Allergies   ROS: A complete ROS was performed with pertinent positives/negatives noted in the HPI. The remainder of the ROS are negative.    Objective:   Today's Vitals   03/27/23 0806  BP: 108/70  Pulse: 74  Temp: 98.7 F (37.1 C)  TempSrc: Temporal  SpO2: 99%  Weight:  187 lb 6.4 oz (85 kg)  Height: 5' 11.75" (1.822 m)    GENERAL: Well-appearing, in NAD. Well nourished.  SKIN: Pink, warm and dry. No rash, lesion, ulceration, or ecchymoses.  NECK: Trachea midline. Full ROM w/o pain or tenderness. No lymphadenopathy.  RESPIRATORY: Chest wall symmetrical. Respirations even and non-labored. Breath sounds clear to auscultation bilaterally.  CARDIAC: S1, S2 present, regular rate and rhythm. Peripheral pulses 2+ bilaterally.   EXTREMITIES: Without clubbing, cyanosis, or edema.  NEUROLOGIC:  Steady, even gait.  PSYCH/MENTAL STATUS: Alert, oriented x 3. Cooperative, appropriate mood and affect.   Health Maintenance Due  Topic Date Due   HIV Screening  Never done   Hepatitis C Screening  Never done    No results found for any visits on 03/27/23.  The ASCVD Risk score (Arnett DK, et al., 2019) failed to calculate for the following reasons:   The 2019 ASCVD risk score is only valid for ages 2 to 65     Assessment & Plan:  Assessment and Plan    Hypertension Well controlled on Lisinopril 20mg  daily. Home readings between 115-125 systolic. No associated headaches or chest pain. -Continue Lisinopril 20mg  daily.  Depression/Anxiety History of significant loss and work stress. Discontinued Sertraline due to sexual side effects. Currently not on any medication and struggling with mood and coping. -Start Bupropion 100mg  twice daily -Follow up in 6 weeks via virtual visit to assess response and tolerability.       No images are attached to the encounter or orders placed in the encounter. Meds ordered this encounter  Medications   lisinopril (ZESTRIL) 20 MG tablet    Sig: Take 1 tablet (20 mg total) by mouth daily.    Dispense:  90 tablet    Refill:  3    Supervising Provider:   Garnette Gunner [[id number]]   buPROPion (WELLBUTRIN) 100 MG tablet    Sig: Take 1 tablet (100 mg total) by mouth 2 (two) times daily.    Dispense:  60 tablet    Refill:  1    Supervising Provider:   Garnette Gunner [[id number]]    Return in about 6 weeks (around 05/08/2023) for Anxiety/Depression (virtual visit).   Salvatore Decent, FNP

## 2023-05-20 ENCOUNTER — Other Ambulatory Visit: Payer: Self-pay | Admitting: Internal Medicine

## 2023-05-20 DIAGNOSIS — F419 Anxiety disorder, unspecified: Secondary | ICD-10-CM

## 2023-05-20 NOTE — Telephone Encounter (Signed)
 Copied from CRM [id number]. Topic: Clinical - Medication Question >> May 20, 2023 12:20 PM Kathryne Eriksson wrote: Reason for CRM: buPROPion [facility]) 100 MG tablet >> May 20, 2023 12:22 PM Kathryne Eriksson wrote: Patient is wanting to know if he could have his prescription filled, just enough until his upcoming appointment. Patient only has 4 days left of this medication and will be completely out come Friday.

## 2023-05-29 ENCOUNTER — Telehealth (INDEPENDENT_AMBULATORY_CARE_PROVIDER_SITE_OTHER): Payer: Self-pay | Admitting: Nurse Practitioner

## 2023-05-29 ENCOUNTER — Encounter: Payer: Self-pay | Admitting: Nurse Practitioner

## 2023-05-29 VITALS — Ht 71.0 in | Wt 195.0 lb

## 2023-05-29 DIAGNOSIS — F32A Depression, unspecified: Secondary | ICD-10-CM

## 2023-05-29 DIAGNOSIS — I10 Essential (primary) hypertension: Secondary | ICD-10-CM

## 2023-05-29 DIAGNOSIS — F419 Anxiety disorder, unspecified: Secondary | ICD-10-CM

## 2023-05-29 MED ORDER — LISINOPRIL 40 MG PO TABS: 40.0000 mg | ORAL_TABLET | Freq: Every day | ORAL | 0 refills | Status: DC

## 2023-05-29 MED ORDER — BUPROPION HCL 100 MG PO TABS: 100.0000 mg | ORAL_TABLET | Freq: Two times a day (BID) | ORAL | 1 refills | Status: DC

## 2023-05-29 NOTE — Assessment & Plan Note (Signed)
 Chronic, not controlled. BP 130s/80s typically and up to 160s systolic last week. Will increase lisinopril to 40mg  daily. Continue to limit salt and check BP regularly. Follow-up in 3 weeks.

## 2023-05-29 NOTE — Progress Notes (Signed)
 [facility] [address] [place] [address] Dept: [phone] Dept Fax: [phone]  Virtual Video Visit  I connected with Ryan Woods on 05/29/23 at 10:40 AM EDT by a video enabled telemedicine application and verified that I am speaking with the correct person using two identifiers.  Location patient: Home Location provider: Clinic Persons participating in the virtual visit: Patient; Rodman Pickle, NP; Malena Peer, CMA  I discussed the limitations of evaluation and management by telemedicine and the availability of in person appointments. The patient expressed understanding and agreed to proceed.  Chief Complaint  Patient presents with   Anxiety and depression    Follow up    SUBJECTIVE:  HPI: Ryan Woods is a 28 y.o. male who presents to follow-up on depression, anxiety, and hypertension.  He states that his mood is doing a lot better since starting wellbutrin. He is not having any side effects. He is also changing jobs soon to help with some of his stress.   He also notes that his blood pressure was elevated last Friday and caused him to not feel well for a few days. He has been taking his lisinopril daily. It was elevated to 160s/80s. Typically, his blood pressure is 130s/70s-80s.      05/29/2023   10:40 AM 03/27/2023    8:05 AM 05/02/2022    1:30 PM 04/11/2022    9:02 AM 04/11/2022    8:21 AM  Depression screen PHQ 2/9  Decreased Interest 0 2 2 3  0  Down, Depressed, Hopeless 0 3 2 3 3   PHQ - 2 Score 0 5 4 6 3   Altered sleeping 0  0 3   Tired, decreased energy 1  1 2    Change in appetite 0  0 3   Feeling bad or failure about yourself  1  3 3    Trouble concentrating 0  1 2   Moving slowly or fidgety/restless 1  1 1    Suicidal thoughts 0  2 2   PHQ-9 Score 3  12 22    Difficult doing work/chores Not difficult at all  Somewhat difficult Very difficult       05/29/2023   10:41 AM  05/02/2022    1:31 PM 04/11/2022    9:03 AM 08/07/2021    3:34 PM  GAD 7 : Generalized Anxiety Score  Nervous, Anxious, on Edge 1 1 3 3   Control/stop worrying 1 2 3 1   Worry too much - different things 1 2 3 2   Trouble relaxing 1 2 2 1   Restless 1 0 3 2  Easily annoyed or irritable 0 0 2 1  Afraid - awful might happen 0 1 3 1   Total GAD 7 Score 5 8 19 11   Anxiety Difficulty Not difficult at all Somewhat difficult Very difficult Somewhat difficult    Patient Active Problem List   Diagnosis Date Noted   Primary hypertension 04/11/2022   Varicocele 08/07/2021   IFG (impaired fasting glucose) 08/07/2021   Elevated blood pressure reading 08/07/2021   Routine general medical examination at a health care facility 08/09/2012   Migraine 04/07/2012   Need for prophylactic vaccination with combined diphtheria-tetanus-pertussis (DTP) vaccine 04/07/2012   Need for prophylactic vaccination against hepatitis A 04/07/2012   Back pain, lumbosacral 04/07/2012   Anxiety and depression 04/07/2012    Past Surgical History:  Procedure Laterality Date   APPENDECTOMY      Family History  Problem Relation Age of Onset  Heart disease Mother    Diabetes Father    Hyperlipidemia Father    Hypertension Father    Arthritis Father    Thyroid disease Father    Cancer Maternal Grandmother        throat   Cancer Maternal Grandfather        lung   Heart disease Maternal Grandfather    Thyroid disease Paternal Grandmother    Heart disease Paternal Grandfather     Social History   Tobacco Use   Smoking status: Former    Current packs/day: 0.00    Types: Cigarettes    Quit date: 2015    Years since quitting: 10.2   Smokeless tobacco: Never  Vaping Use   Vaping status: Never Used  Substance Use Topics   Alcohol use: Yes    Comment: occasionally   Drug use: No     Current Outpatient Medications:    hydrOXYzine (VISTARIL) 25 MG capsule, TAKE 1 CAPSULE BY MOUTH EVERY 8 HOURS AS NEEDED FOR  ANXIETY, Disp: 30 capsule, Rfl: 0   lisinopril (ZESTRIL) 40 MG tablet, Take 1 tablet (40 mg total) by mouth daily., Disp: 90 tablet, Rfl: 0   buPROPion (WELLBUTRIN) 100 MG tablet, Take 1 tablet (100 mg total) by mouth 2 (two) times daily., Disp: 180 tablet, Rfl: 1  No Known Allergies  ROS: See pertinent positives and negatives per HPI.  OBSERVATIONS/OBJECTIVE:  VITALS per patient if applicable: Today's Vitals   05/29/23 1039  Weight: 195 lb (88.5 kg)  Height: 5\' 11"  (1.803 m)   Body mass index is 27.2 kg/m.    GENERAL: Alert and oriented. Appears well and in no acute distress.  HEENT: Atraumatic. Conjunctiva clear. No obvious abnormalities on inspection of external nose and ears.  NECK: Normal movements of the head and neck.  LUNGS: On inspection, no signs of respiratory distress. Breathing rate appears normal. No obvious gross SOB, gasping or wheezing, and no conversational dyspnea.  CV: No obvious cyanosis.  MS: Moves all visible extremities without noticeable abnormality.  PSYCH/NEURO: Pleasant and cooperative. No obvious depression or anxiety. Speech and thought processing grossly intact.  ASSESSMENT AND PLAN:  Problem List Items Addressed This Visit       Cardiovascular and Mediastinum   Primary hypertension - Primary   Chronic, not controlled. BP 130s/80s typically and up to 160s systolic last week. Will increase lisinopril to 40mg  daily. Continue to limit salt and check BP regularly. Follow-up in 3 weeks.       Relevant Medications   lisinopril (ZESTRIL) 40 MG tablet     Other   Anxiety and depression   Chronic, stable. He has been doing well since starting the wellbutrin and not having many side effects. Continue wellbutrin 100mg  BID. Refill sent to pharmacy.       Relevant Medications   buPROPion (WELLBUTRIN) 100 MG tablet     I discussed the assessment and treatment plan with the patient. The patient was provided an opportunity to ask questions and  all were answered. The patient agreed with the plan and demonstrated an understanding of the instructions.   The patient was advised to call back or seek an in-person evaluation if the symptoms worsen or if the condition fails to improve as anticipated.   Gerre Scull, NP

## 2023-05-29 NOTE — Assessment & Plan Note (Signed)
 Chronic, stable. He has been doing well since starting the wellbutrin and not having many side effects. Continue wellbutrin 100mg  BID. Refill sent to pharmacy.

## 2023-05-29 NOTE — Patient Instructions (Signed)
 It was great to see you!  Let's increase your lisinopril to 40mg  daily.   Let's follow-up April 17 at 11am, sooner if you have concerns.  If a referral was placed today, you will be contacted for an appointment. Please note that routine referrals can sometimes take up to 3-4 weeks to process. Please call our office if you haven't heard anything after this time frame.  Take care,  Rodman Pickle, NP

## 2023-05-30 ENCOUNTER — Telehealth: Payer: Self-pay

## 2023-05-30 ENCOUNTER — Ambulatory Visit: Payer: Self-pay | Admitting: Nurse Practitioner

## 2023-05-30 ENCOUNTER — Encounter: Payer: Self-pay | Admitting: Nurse Practitioner

## 2023-05-30 VITALS — BP 142/90 | HR 79 | Temp 97.9°F | Ht 71.0 in | Wt 184.2 lb

## 2023-05-30 DIAGNOSIS — I10 Essential (primary) hypertension: Secondary | ICD-10-CM

## 2023-05-30 DIAGNOSIS — G43709 Chronic migraine without aura, not intractable, without status migrainosus: Secondary | ICD-10-CM

## 2023-05-30 DIAGNOSIS — R079 Chest pain, unspecified: Secondary | ICD-10-CM | POA: Insufficient documentation

## 2023-05-30 LAB — CBC WITH DIFFERENTIAL/PLATELET
Basophils Absolute: 0 10*3/uL (ref 0.0–0.1)
Basophils Relative: 0.2 % (ref 0.0–3.0)
Eosinophils Absolute: 0 10*3/uL (ref 0.0–0.7)
Eosinophils Relative: 0.2 % (ref 0.0–5.0)
HCT: 47.1 % (ref 39.0–52.0)
Hemoglobin: 16 g/dL (ref 13.0–17.0)
Lymphocytes Relative: 18.7 % (ref 12.0–46.0)
Lymphs Abs: 0.9 10*3/uL (ref 0.7–4.0)
MCHC: 33.9 g/dL (ref 30.0–36.0)
MCV: 88.1 fl (ref 78.0–100.0)
Monocytes Absolute: 0.3 10*3/uL (ref 0.1–1.0)
Monocytes Relative: 7.1 % (ref 3.0–12.0)
Neutro Abs: 3.4 10*3/uL (ref 1.4–7.7)
Neutrophils Relative %: 73.8 % (ref 43.0–77.0)
Platelets: 246 10*3/uL (ref 150.0–400.0)
RBC: 5.34 Mil/uL (ref 4.22–5.81)
RDW: 13.1 % (ref 11.5–15.5)
WBC: 4.6 10*3/uL (ref 4.0–10.5)

## 2023-05-30 LAB — COMPREHENSIVE METABOLIC PANEL WITH GFR
ALT: 19 U/L (ref 0–53)
AST: 15 U/L (ref 0–37)
Albumin: 5.3 g/dL — ABNORMAL HIGH (ref 3.5–5.2)
Alkaline Phosphatase: 82 U/L (ref 39–117)
BUN: 17 mg/dL (ref 6–23)
CO2: 29 meq/L (ref 19–32)
Calcium: 10.1 mg/dL (ref 8.4–10.5)
Chloride: 101 meq/L (ref 96–112)
Creatinine, Ser: 0.9 mg/dL (ref 0.40–1.50)
GFR: 115.46 mL/min (ref >60.00)
Glucose, Bld: 89 mg/dL (ref 70–99)
Potassium: 4.1 meq/L (ref 3.5–5.1)
Sodium: 138 meq/L (ref 135–145)
Total Bilirubin: 0.6 mg/dL (ref 0.2–1.2)
Total Protein: 7.8 g/dL (ref 6.0–8.3)

## 2023-05-30 LAB — LIPID PANEL
Cholesterol: 171 mg/dL (ref 0–200)
HDL: 49 mg/dL (ref >39.00)
LDL Cholesterol: 113 mg/dL — ABNORMAL HIGH (ref 0–99)
NonHDL: 122.02
Total CHOL/HDL Ratio: 3
Triglycerides: 46 mg/dL (ref 0.0–149.0)
VLDL: 9.2 mg/dL (ref 0.0–40.0)

## 2023-05-30 NOTE — Progress Notes (Signed)
 Established Patient Office Visit  Subjective   Patient ID: Ryan Woods, male    DOB: [birth year]  Age: 28 y.o. MRN: [id number]  Chief Complaint  Patient presents with   Hypertension    Follow up and request lab work, sensation of icy hot on chest that extends to neck    HPI  Discussed the use of AI scribe software for clinical note transcription with the patient, who gave verbal consent to proceed.  History of Present Illness   The patient, with a history of hypertension, presents with a chief complaint of an unusual, non-painful sensation in the chest, described as an "icy hot" feeling. This sensation, which started a week ago, is intermittent and has been lasting longer than usual. It is localized in the left chest, around the heart area, and occasionally radiates to the neck and down the left arm. The sensation is more of a cold feeling, similar to the iciness of an icy hot patch, and intensifies upon touch. The patient also reports a feeling of tightness in the chest, which is more noticeable when lying down at night.  In addition to the chest sensation, the patient reports experiencing headaches almost daily, which are sometimes severe and last up to eight hours. The headaches are often sensitive to light and make it difficult for the patient to find a comfortable position. The patient has been managing the headaches with over-the-counter medications like ibuprofen, Tylenol, and extreme migraine medication.  The patient also experiences occasional lightheadedness but denies any associated dizziness. There is no reported nausea, fatigue, or skin rash. The patient has not started any new medications or supplements recently, except for a switch from Tylenol or ibuprofen to extreme migraine medication for headache management.      ROS See pertinent positives and negatives per HPI.    Objective:     BP (!) 142/90 (BP Location: Right Arm, Cuff Size: Normal)   Pulse 79   Temp  97.9 F (36.6 C)   Ht 5\' 11"  (1.803 m)   Wt 184 lb 3.2 oz (83.6 kg)   SpO2 98%   BMI 25.69 kg/m     Physical Exam Vitals and nursing note reviewed.  Constitutional:      Appearance: Normal appearance.  HENT:     Head: Normocephalic.  Eyes:     Conjunctiva/sclera: Conjunctivae normal.  Cardiovascular:     Rate and Rhythm: Normal rate and regular rhythm.     Pulses: Normal pulses.     Heart sounds: Normal heart sounds.  Pulmonary:     Effort: Pulmonary effort is normal.     Breath sounds: Normal breath sounds.  Musculoskeletal:        General: No tenderness. Normal range of motion.     Cervical back: Normal range of motion.  Skin:    General: Skin is warm.  Neurological:     General: No focal deficit present.     Mental Status: He is alert and oriented to person, place, and time.  Psychiatric:        Mood and Affect: Mood normal.        Behavior: Behavior normal.        Thought Content: Thought content normal.        Judgment: Judgment normal.      Assessment & Plan:   Problem List Items Addressed This Visit       Cardiovascular and Mediastinum   Migraine   He experiences frequent headaches with photophobia, lasting  up to 8 hours. Over-the-counter medications are used. Preventive medication was discussed but deferred. If headaches persist, preventive medication will be reconsidered.      Primary hypertension   Blood pressure readings are elevated. He is advised to increase lisinopril to 40mg  like discussed at visit yesterday.       Relevant Orders   CBC with Differential/Platelet   Comprehensive metabolic panel with GFR   Lipid panel     Other   Chest pain - Primary   Acute ongoing for the last 5 days intermittently. Described as a cold sensation. EKG showed normal sinus rhythm with a heart rate of 78, no ST or T wave changes.  He should monitor for rash development. Check CMP, CBC, TSH, lipid panel, and vitamin B12.      Relevant Orders   CBC with  Differential/Platelet   Comprehensive metabolic panel with GFR   Lipid panel   TSH   Vitamin B12   EKG 12-Lead (Completed)    Return if symptoms worsen or fail to improve, for at next scheduled visit.    Gerre Scull, NP

## 2023-05-30 NOTE — Assessment & Plan Note (Signed)
 Acute ongoing for the last 5 days intermittently. Described as a cold sensation. EKG showed normal sinus rhythm with a heart rate of 78, no ST or T wave changes.  He should monitor for rash development. Check CMP, CBC, TSH, lipid panel, and vitamin B12.

## 2023-05-30 NOTE — Patient Instructions (Signed)
 It was great to see you!  We are checking your labs today and will let you know the results via mychart/phone.   Increase your lisinopril to 40 mg daily  Let's follow-up at your next scheduled visit, sooner if you have concerns.  If a referral was placed today, you will be contacted for an appointment. Please note that routine referrals can sometimes take up to 3-4 weeks to process. Please call our office if you haven't heard anything after this time frame.  Take care,  Rodman Pickle, NP

## 2023-05-30 NOTE — Progress Notes (Signed)
 EKG interpreted by me on 05/30/23 showed normal sinus rhythm with a heart rate of 78. No ST or T wave changes.

## 2023-05-30 NOTE — Assessment & Plan Note (Signed)
 Blood pressure readings are elevated. He is advised to increase lisinopril to 40mg  like discussed at visit yesterday.

## 2023-05-30 NOTE — Telephone Encounter (Signed)
 I called and spoke with patient and he has a 10am appointment to address his high blood pressure and request lab work.

## 2023-05-30 NOTE — Telephone Encounter (Signed)
 Copied from CRM [id number]. Topic: Clinical - Request for Lab/Test Order >> May 30, 2023  8:39 AM Drema Balzarine wrote: Reason for CRM: Patient had telemedicine appointment yesterday with Janna Arch. She said he needs labwork and he wants to come in today for that but there is no order in. Please call patient to schedule lab appointment for today once order is in.

## 2023-05-30 NOTE — Assessment & Plan Note (Signed)
 He experiences frequent headaches with photophobia, lasting up to 8 hours. Over-the-counter medications are used. Preventive medication was discussed but deferred. If headaches persist, preventive medication will be reconsidered.

## 2023-05-31 ENCOUNTER — Encounter: Payer: Self-pay | Admitting: Nurse Practitioner

## 2023-05-31 LAB — VITAMIN B12: Vitamin B-12: 138 pg/mL — ABNORMAL LOW (ref 211–911)

## 2023-05-31 LAB — TSH: TSH: 2.22 u[IU]/mL (ref 0.35–5.50)

## 2023-06-20 ENCOUNTER — Ambulatory Visit (INDEPENDENT_AMBULATORY_CARE_PROVIDER_SITE_OTHER): Payer: Self-pay | Admitting: Nurse Practitioner

## 2023-06-20 ENCOUNTER — Encounter: Payer: Self-pay | Admitting: Nurse Practitioner

## 2023-06-20 VITALS — BP 122/62 | HR 71 | Temp 97.7°F | Ht 71.0 in | Wt 187.8 lb

## 2023-06-20 DIAGNOSIS — I10 Essential (primary) hypertension: Secondary | ICD-10-CM

## 2023-06-20 DIAGNOSIS — R079 Chest pain, unspecified: Secondary | ICD-10-CM

## 2023-06-20 DIAGNOSIS — G43709 Chronic migraine without aura, not intractable, without status migrainosus: Secondary | ICD-10-CM

## 2023-06-20 DIAGNOSIS — E538 Deficiency of other specified B group vitamins: Secondary | ICD-10-CM

## 2023-06-20 NOTE — Progress Notes (Signed)
 Established Patient Office Visit  Subjective   Patient ID: Ryan Woods, male    DOB: [birth year]  Age: 28 y.o. MRN: [id number]  Chief Complaint  Patient presents with   Hypertension    Follow up, no concerns    HPI  Discussed the use of AI scribe software for clinical note transcription with the patient, who gave verbal consent to proceed.  History of Present Illness   The patient, with a history of hypertension, He has increased lisinopril to 40mg , which has resulted in a significant reduction in his blood pressure and an overall improvement in how he feels. He has not been monitoring his blood pressure at home as regularly as he should. The "menthol sensation" he described in his chest has not been happening as often, but still happens occasionally. He did not start the vitamin B12 supplement yet.   The patient also reports persistent headaches, occurring on four to five days out of the week. Occasionally, these headaches escalate to migraines. Despite the frequency and severity of these headaches, the patient is hesitant to start any preventative medications at this time.        ROS See pertinent positives and negatives per HPI.    Objective:     BP 122/62 (BP Location: Left Arm, Patient Position: Sitting, Cuff Size: Normal)   Pulse 71   Temp 97.7 F (36.5 C)   Ht 5\' 11"  (1.803 m)   Wt 187 lb 12.8 oz (85.2 kg)   SpO2 98%   BMI 26.19 kg/m    Physical Exam Vitals and nursing note reviewed.  Constitutional:      Appearance: Normal appearance.  HENT:     Head: Normocephalic.  Eyes:     Conjunctiva/sclera: Conjunctivae normal.  Cardiovascular:     Rate and Rhythm: Normal rate and regular rhythm.     Pulses: Normal pulses.     Heart sounds: Normal heart sounds.  Pulmonary:     Effort: Pulmonary effort is normal.     Breath sounds: Normal breath sounds.  Musculoskeletal:     Cervical back: Normal range of motion.  Skin:    General: Skin is warm.   Neurological:     General: No focal deficit present.     Mental Status: He is alert and oriented to person, place, and time.  Psychiatric:        Mood and Affect: Mood normal.        Behavior: Behavior normal.        Thought Content: Thought content normal.        Judgment: Judgment normal.      Assessment & Plan:   Problem List Items Addressed This Visit       Cardiovascular and Mediastinum   Migraine - Primary   Chronic headaches occur 4-5 days a week with occasional migraines. He prefers to avoid preventive medications. Consider magnesium supplementation at bedtime. Reassess headache frequency and severity at the next follow-up.      Primary hypertension   Chronic, stable. Continue lisinopril 40mg  daily. Last CMP reviewed. Follow-up in 6 months.         Other   Chest pain   Intermittent chest pain with a menthol sensation persists but is less frequent. Low B12 may contribute. Start B12 supplementation 1,028mcg daily and consider B12 injections if symptoms persist.      B12 deficiency   Start B12 supplementation 1,011mcg daily and consider B12 injections if symptoms persist  Return in about 6 months (around 12/20/2023) for CPE.    Odette Benjamin, NP

## 2023-06-20 NOTE — Assessment & Plan Note (Signed)
 Chronic headaches occur 4-5 days a week with occasional migraines. He prefers to avoid preventive medications. Consider magnesium supplementation at bedtime. Reassess headache frequency and severity at the next follow-up.

## 2023-06-20 NOTE — Assessment & Plan Note (Signed)
 Chronic, stable. Continue lisinopril 40mg  daily. Last CMP reviewed. Follow-up in 6 months.

## 2023-06-20 NOTE — Assessment & Plan Note (Signed)
 Intermittent chest pain with a menthol sensation persists but is less frequent. Low B12 may contribute. Start B12 supplementation 1,000mcg daily and consider B12 injections if symptoms persist.

## 2023-06-20 NOTE — Patient Instructions (Signed)
 It was great to see you!  Keep taking lisinopril 40mg  daily  Let's follow-up in 6 months, sooner if you have concerns.  If a referral was placed today, you will be contacted for an appointment. Please note that routine referrals can sometimes take up to 3-4 weeks to process. Please call our office if you haven't heard anything after this time frame.  Take care,  Rheba Cedar, NP

## 2023-06-20 NOTE — Assessment & Plan Note (Signed)
 Start B12 supplementation 1,000mcg daily and consider B12 injections if symptoms persist

## 2023-11-11 ENCOUNTER — Encounter: Payer: Self-pay | Admitting: Nurse Practitioner

## 2023-11-11 ENCOUNTER — Ambulatory Visit (INDEPENDENT_AMBULATORY_CARE_PROVIDER_SITE_OTHER): Payer: Self-pay | Admitting: Nurse Practitioner

## 2023-11-11 VITALS — BP 138/72 | HR 71 | Temp 97.9°F | Ht 71.0 in | Wt 185.2 lb

## 2023-11-11 DIAGNOSIS — N469 Male infertility, unspecified: Secondary | ICD-10-CM

## 2023-11-11 DIAGNOSIS — Z0001 Encounter for general adult medical examination with abnormal findings: Secondary | ICD-10-CM | POA: Diagnosis not present

## 2023-11-11 DIAGNOSIS — G43709 Chronic migraine without aura, not intractable, without status migrainosus: Secondary | ICD-10-CM | POA: Diagnosis not present

## 2023-11-11 DIAGNOSIS — E538 Deficiency of other specified B group vitamins: Secondary | ICD-10-CM

## 2023-11-11 DIAGNOSIS — F32A Depression, unspecified: Secondary | ICD-10-CM

## 2023-11-11 DIAGNOSIS — I861 Scrotal varices: Secondary | ICD-10-CM | POA: Diagnosis not present

## 2023-11-11 DIAGNOSIS — F419 Anxiety disorder, unspecified: Secondary | ICD-10-CM | POA: Diagnosis not present

## 2023-11-11 DIAGNOSIS — I10 Essential (primary) hypertension: Secondary | ICD-10-CM

## 2023-11-11 DIAGNOSIS — Z23 Encounter for immunization: Secondary | ICD-10-CM

## 2023-11-11 MED ORDER — SUMATRIPTAN SUCCINATE 50 MG PO TABS: 50.0000 mg | ORAL_TABLET | ORAL | 0 refills | Status: AC | PRN

## 2023-11-11 MED ORDER — TOPIRAMATE 25 MG PO TABS: 25.0000 mg | ORAL_TABLET | Freq: Two times a day (BID) | ORAL | 1 refills | Status: DC

## 2023-11-11 MED ORDER — ONDANSETRON HCL 4 MG PO TABS: 4.0000 mg | ORAL_TABLET | Freq: Three times a day (TID) | ORAL | 0 refills | Status: AC | PRN

## 2023-11-11 MED ORDER — BUPROPION HCL 100 MG PO TABS: 100.0000 mg | ORAL_TABLET | Freq: Two times a day (BID) | ORAL | 3 refills | Status: AC

## 2023-11-11 MED ORDER — LISINOPRIL 40 MG PO TABS: 40.0000 mg | ORAL_TABLET | Freq: Every day | ORAL | 3 refills | Status: AC

## 2023-11-11 NOTE — Assessment & Plan Note (Signed)
 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 Previous urologist was unsatisfactory. He is seeking a second opinion for treatment options, including surgery. Refer to a new urologist in [place] for evaluation and management.

## 2023-11-11 NOTE — Assessment & Plan Note (Signed)
 Chronic, stable. Continue lisinopril 40mg  daily. Last CMP reviewed. Follow-up in 6 months.

## 2023-11-11 NOTE — Progress Notes (Signed)
 BP 138/72 (BP Location: Left Arm, Patient Position: Sitting, Cuff Size: Normal)   Pulse 71   Temp 97.9 F (36.6 C)   Ht 5' 11 (1.803 m)   Wt 185 lb 3.2 oz (84 kg)   SpO2 98%   BMI 25.83 kg/m    Subjective:    Patient ID: Ryan Woods, male    DOB: 07-08-1995, 28 y.o.   MRN: [id number]  CC: Chief Complaint  Patient presents with   Annual Exam    Request a referral to a different urologist and discuss frequent migraines, Flu Vaccine    HPI: Ryan Woods is a 28 y.o. male presenting on 11/11/2023 for comprehensive medical examination. Current medical complaints include:more frequent migraines  Discussed the use of AI scribe software for clinical note transcription with the patient, who gave verbal consent to proceed.  History of Present Illness   Ryan Woods is a 28 year old male who presents with worsening headaches.  He experiences severe headaches over the past couple of months, with two significant episodes in the past week. The headaches are debilitating, leading to nausea and vomiting, and are unresponsive to over-the-counter medications or hydrocodone. They are associated sometimes with sexual activity, starting in the neck and traveling to the head, lasting up to 72 hours. This has affected his ability to work, as he has left work early due to nausea and vomiting.  He previously took amitriptyline  for headaches but does not recall its effectiveness. He has not tried Imitrex  or rizatriptan. Currently, he uses Tylenol , extra strength headache medications, and occasionally hydrocodone. Nausea has developed more recently. He has not started a vitamin B12 supplement despite low levels in previous blood work.     He currently lives with: wife  Depression and Anxiety Screen done today and results listed below:     11/11/2023   10:13 AM 05/29/2023   10:40 AM 03/27/2023    8:05 AM 05/02/2022    1:30 PM 04/11/2022    9:02 AM  Depression screen PHQ 2/9   Decreased Interest 1 0 2 2 3   Down, Depressed, Hopeless 1 0 3 2 3   PHQ - 2 Score 2 0 5 4 6   Altered sleeping 2 0  0 3  Tired, decreased energy 1 1  1 2   Change in appetite 0 0  0 3  Feeling bad or failure about yourself  1 1  3 3   Trouble concentrating 0 0  1 2  Moving slowly or fidgety/restless 0 1  1 1   Suicidal thoughts 1 0  2 2  PHQ-9 Score 7 3  12 22   Difficult doing work/chores Not difficult at all Not difficult at all  Somewhat difficult Very difficult      11/11/2023   10:13 AM 05/29/2023   10:41 AM 05/02/2022    1:31 PM 04/11/2022    9:03 AM  GAD 7 : Generalized Anxiety Score  Nervous, Anxious, on Edge 2 1 1 3   Control/stop worrying 1 1 2 3   Worry too much - different things 1 1 2 3   Trouble relaxing 1 1 2 2   Restless 0 1 0 3  Easily annoyed or irritable 1 0 0 2  Afraid - awful might happen 1 0 1 3  Total GAD 7 Score 7 5 8 19   Anxiety Difficulty Not difficult at all Not difficult at all Somewhat difficult Very difficult    The patient does not have a history of falls. I did  not complete a risk assessment for falls. A plan of care for falls was not documented.   Past Medical History:  Past Medical History:  Diagnosis Date   Anxiety    Migraine     Surgical History:  Past Surgical History:  Procedure Laterality Date   APPENDECTOMY      Medications:  Current Outpatient Medications on File Prior to Visit  Medication Sig   hydrOXYzine  (VISTARIL ) 25 MG capsule TAKE 1 CAPSULE BY MOUTH EVERY 8 HOURS AS NEEDED FOR ANXIETY   No current facility-administered medications on file prior to visit.    Allergies:  No Known Allergies  Social History:  Social History   Socioeconomic History   Marital status: Married    Spouse name: Not on file   Number of children: Not on file   Years of education: Not on file   Highest education level: Not on file  Occupational History   Not on file  Tobacco Use   Smoking status: Former    Current packs/day: 0.00    Types:  Cigarettes    Quit date: 2015    Years since quitting: 10.6   Smokeless tobacco: Never  Vaping Use   Vaping status: Never Used  Substance and Sexual Activity   Alcohol use: Yes    Comment: occasionally   Drug use: No   Sexual activity: Yes  Other Topics Concern   Not on file  Social History Narrative   Not on file   Social Drivers of Health   Financial Resource Strain: Low Risk  (03/26/2023)   Overall Financial Resource Strain (CARDIA)    Difficulty of Paying Living Expenses: Not hard at all  Food Insecurity: No Food Insecurity (03/26/2023)   Hunger Vital Sign    Worried About Running Out of Food in the Last Year: Never true    Ran Out of Food in the Last Year: Never true  Transportation Needs: Patient Declined (03/26/2023)   PRAPARE - Transportation    Lack of Transportation (Medical): Patient declined    Lack of Transportation (Non-Medical): Patient declined  Physical Activity: Insufficiently Active (03/26/2023)   Exercise Vital Sign    Days of Exercise per Week: 3 days    Minutes of Exercise per Session: 40 min  Stress: Stress Concern Present (03/26/2023)   [employer] of Occupational Health - Occupational Stress Questionnaire    Feeling of Stress : Very much  Social Connections: Moderately Isolated (03/26/2023)   Social Connection and Isolation Panel    Frequency of Communication with Friends and Family: Three times a week    Frequency of Social Gatherings with Friends and Family: Once a week    Attends Religious Services: Never    Database administrator or Organizations: No    Attends Engineer, structural: Not on file    Marital Status: Married  Catering manager Violence: Not on file   Social History   Tobacco Use  Smoking Status Former   Current packs/day: 0.00   Types: Cigarettes   Quit date: 2015   Years since quitting: 10.6  Smokeless Tobacco Never   Social History   Substance and Sexual Activity  Alcohol Use Yes   Comment: occasionally     Family History:  Family History  Problem Relation Age of Onset   Heart disease Mother    Diabetes Father    Hyperlipidemia Father    Hypertension Father    Arthritis Father    Thyroid disease Father    Cancer Maternal  Grandmother        throat   Cancer Maternal Grandfather        lung   Heart disease Maternal Grandfather    Thyroid disease Paternal Grandmother    Heart disease Paternal Grandfather     Past medical history, surgical history, medications, allergies, family history and social history reviewed with patient today and changes made to appropriate areas of the chart.   Review of Systems  Constitutional: Negative.   HENT: Negative.    Eyes: Negative.   Respiratory: Negative.    Cardiovascular: Negative.   Gastrointestinal: Negative.   Genitourinary: Negative.   Musculoskeletal: Negative.   Skin: Negative.   Neurological:  Positive for headaches. Negative for dizziness.  Psychiatric/Behavioral: Negative.     All other ROS negative except what is listed above and in the HPI.      Objective:    BP 138/72 (BP Location: Left Arm, Patient Position: Sitting, Cuff Size: Normal)   Pulse 71   Temp 97.9 F (36.6 C)   Ht 5' 11 (1.803 m)   Wt 185 lb 3.2 oz (84 kg)   SpO2 98%   BMI 25.83 kg/m   Wt Readings from Last 3 Encounters:  11/11/23 185 lb 3.2 oz (84 kg)  06/20/23 187 lb 12.8 oz (85.2 kg)  05/30/23 184 lb 3.2 oz (83.6 kg)    Physical Exam Vitals and nursing note reviewed.  Constitutional:      General: He is not in acute distress.    Appearance: Normal appearance.  HENT:     Head: Normocephalic and atraumatic.     Right Ear: Tympanic membrane, ear canal and external ear normal.     Left Ear: Tympanic membrane, ear canal and external ear normal.     Mouth/Throat:     Mouth: Mucous membranes are moist.     Pharynx: No posterior oropharyngeal erythema.  Eyes:     Conjunctiva/sclera: Conjunctivae normal.  Cardiovascular:     Rate and Rhythm:  Normal rate and regular rhythm.     Pulses: Normal pulses.     Heart sounds: Normal heart sounds.  Pulmonary:     Effort: Pulmonary effort is normal.     Breath sounds: Normal breath sounds.  Abdominal:     Palpations: Abdomen is soft.     Tenderness: There is no abdominal tenderness.  Musculoskeletal:        General: Normal range of motion.     Cervical back: Normal range of motion and neck supple. No tenderness.     Right lower leg: No edema.     Left lower leg: No edema.  Lymphadenopathy:     Cervical: No cervical adenopathy.  Skin:    General: Skin is warm and dry.  Neurological:     General: No focal deficit present.     Mental Status: He is alert and oriented to person, place, and time.     Cranial Nerves: No cranial nerve deficit.     Coordination: Coordination normal.     Gait: Gait normal.  Psychiatric:        Mood and Affect: Mood normal.        Behavior: Behavior normal.        Thought Content: Thought content normal.        Judgment: Judgment normal.     Results for orders placed or performed in visit on 05/30/23  CBC with Differential/Platelet   Collection Time: 05/30/23 10:56 AM  Result Value Ref Range  WBC 4.6 4.0 - 10.5 K/uL   RBC 5.34 4.22 - 5.81 Mil/uL   Hemoglobin 16.0 13.0 - 17.0 g/dL   HCT 52.8 60.9 - 47.9 %   MCV 88.1 78.0 - 100.0 fl   MCHC 33.9 30.0 - 36.0 g/dL   RDW 86.8 88.4 - 84.4 %   Platelets 246.0 150.0 - 400.0 K/uL   Neutrophils Relative % 73.8 43.0 - 77.0 %   Lymphocytes Relative 18.7 12.0 - 46.0 %   Monocytes Relative 7.1 3.0 - 12.0 %   Eosinophils Relative 0.2 0.0 - 5.0 %   Basophils Relative 0.2 0.0 - 3.0 %   Neutro Abs 3.4 1.4 - 7.7 K/uL   Lymphs Abs 0.9 0.7 - 4.0 K/uL   Monocytes Absolute 0.3 0.1 - 1.0 K/uL   Eosinophils Absolute 0.0 0.0 - 0.7 K/uL   Basophils Absolute 0.0 0.0 - 0.1 K/uL  Comprehensive metabolic panel with GFR   Collection Time: 05/30/23 10:56 AM  Result Value Ref Range   Sodium 138 135 - 145 mEq/L    Potassium 4.1 3.5 - 5.1 mEq/L   Chloride 101 96 - 112 mEq/L   CO2 29 19 - 32 mEq/L   Glucose, Bld 89 70 - 99 mg/dL   BUN 17 6 - 23 mg/dL   Creatinine, Ser 9.09 0.40 - 1.50 mg/dL   Total Bilirubin 0.6 0.2 - 1.2 mg/dL   Alkaline Phosphatase 82 39 - 117 U/L   AST 15 0 - 37 U/L   ALT 19 0 - 53 U/L   Total Protein 7.8 6.0 - 8.3 g/dL   Albumin 5.3 (H) 3.5 - 5.2 g/dL   GFR 884.53 >39.99 mL/min   Calcium 10.1 8.4 - 10.5 mg/dL  Lipid panel   Collection Time: 05/30/23 10:56 AM  Result Value Ref Range   Cholesterol 171 0 - 200 mg/dL   Triglycerides 53.9 0.0 - 149.0 mg/dL   HDL 50.99 >60.99 mg/dL   VLDL 9.2 0.0 - 59.9 mg/dL   LDL Cholesterol 886 (H) 0 - 99 mg/dL   Total CHOL/HDL Ratio 3    NonHDL 122.02   TSH   Collection Time: 05/30/23 10:56 AM  Result Value Ref Range   TSH 2.22 0.35 - 5.50 uIU/mL  Vitamin B12   Collection Time: 05/30/23 10:56 AM  Result Value Ref Range   Vitamin B-12 138 (L) 211 - 911 pg/mL      Assessment & Plan:   Problem List Items Addressed This Visit       Cardiovascular and Mediastinum   Migraine   Chronic, not controlled. Chronic migraines have worsened, with recent severe episodes triggered by sexual activity, lasting up to 72 hours, accompanied by nausea and vomiting. He has not used Imitrex  or rizatriptan before. MRI ordered due to intercourse-related headaches and increasing frequency and intensity. Start Topamax  25mg , one tablet daily for seven days, then increase to one tablet twice a day, monitoring for tingling in fingertips. Start Imitrex  50mg , one tablet at headache onset, repeating in two hours if needed, and monitor for drowsiness. Provide zofran  4mg  every 8 hours for nausea as needed. Schedule follow-up in four to six weeks, with an option for a virtual visit.      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 Previous urologist was unsatisfactory. He is seeking a second opinion for treatment options, including surgery. Refer to a new urologist in [place] for evaluation and management.      Relevant Medications   lisinopril  (ZESTRIL ) 40 MG tablet   Other Relevant Orders   Ambulatory referral to Urology   Primary hypertension   Chronic, stable. Continue lisinopril  40mg  daily. Last CMP reviewed. Follow-up in 6 months.       Relevant Medications   lisinopril  (ZESTRIL ) 40 MG tablet     Other   Anxiety and depression   Chronic, stable. He has been doing well since starting the wellbutrin  and not having many side effects. Continue wellbutrin  100mg  BID. Refill sent to pharmacy.       Relevant Medications   buPROPion  (WELLBUTRIN ) 100 MG tablet   B12 deficiency   He has not started supplement yet. Start vitamin B12 supplement, 500 to 1000 mcg daily.       Other Visit Diagnoses       Encounter for general adult medical examination with abnormal findings    -  Primary   Health maintenance reviewed. Discussed nutrition, exercise.     Infertility male       Referral to urology placed.   Relevant Orders   Ambulatory referral to Urology     Immunization due       Flu vaccine given today   Relevant Orders   Flu vaccine HIGH DOSE PF(Fluzone Trivalent)       IMMUNIZATIONS:   - Tdap: Tetanus vaccination status reviewed: last tetanus booster within 10 years. - Influenza: Administered today - Pneumovax: Not applicable - Prevnar: Not applicable - HPV: Not applicable - Shingrix vaccine: Not applicable  SCREENING: - Colonoscopy: Not applicable  Discussed with patient purpose of the colonoscopy is to detect colon cancer at curable precancerous or early stages   - AAA Screening: Not applicable   PATIENT COUNSELING:    Sexuality: Discussed sexually transmitted  diseases, partner selection, use of condoms, avoidance of unintended pregnancy  and contraceptive alternatives.   Advised to avoid cigarette smoking.  I discussed with the patient that most people either abstain from alcohol or drink within safe limits (<=14/week and <=4 drinks/occasion for males, <=7/weeks and <= 3 drinks/occasion for females) and that the risk for alcohol disorders and other health effects rises proportionally with the number of drinks per week and how often a drinker exceeds daily limits.  Discussed cessation/primary prevention of drug use and availability of treatment for abuse.   Diet: Encouraged to adjust caloric intake to maintain  or achieve ideal body weight, to reduce intake of dietary saturated fat and total fat, to limit sodium intake by avoiding high sodium foods and not adding table salt, and to maintain adequate dietary potassium and calcium preferably from fresh fruits, vegetables, and low-fat dairy products.    stressed the importance of regular exercise  Injury prevention: Discussed safety belts, safety helmets, smoke detector, smoking near bedding or upholstery.   Dental health: Discussed importance of regular tooth brushing, flossing, and dental visits.   Follow up plan: NEXT PREVENTATIVE PHYSICAL DUE IN 1 YEAR. Return in about 4 weeks (around 12/09/2023) for 4-6 weeks migraine, may be virtual.  Tinnie DELENA Harada

## 2023-11-11 NOTE — Assessment & Plan Note (Addendum)
 Chronic, not controlled. Chronic migraines have worsened, with recent severe episodes triggered by sexual activity, lasting up to 72 hours, accompanied by nausea and vomiting. He has not used Imitrex  or rizatriptan before. MRI ordered due to intercourse-related headaches and increasing frequency and intensity. Start Topamax  25mg , one tablet daily for seven days, then increase to one tablet twice a day, monitoring for tingling in fingertips. Start Imitrex  50mg , one tablet at headache onset, repeating in two hours if needed, and monitor for drowsiness. Provide zofran  4mg  every 8 hours for nausea as needed. Schedule follow-up in four to six weeks, with an option for a virtual visit.

## 2023-11-11 NOTE — Assessment & Plan Note (Signed)
 Chronic, stable. He has been doing well since starting the wellbutrin and not having many side effects. Continue wellbutrin 100mg  BID. Refill sent to pharmacy.

## 2023-11-11 NOTE — Assessment & Plan Note (Signed)
 He has not started supplement yet. Start vitamin B12 supplement, 500 to 1000 mcg daily.

## 2023-11-11 NOTE — Patient Instructions (Signed)
 It was great to see you!  Start a vitamin B12 supplement 500-1,000 mcg daily   Start topamax  1 tablet daily for 7 days, then 1 tablet twice a day to prevent headaches  Start imitrex  1 tablet at the first sign of a headache, you can take a second dose 2 hours later if needed  Let's follow-up in 4-6 weeks, sooner if you have concerns.  If a referral was placed today, you will be contacted for an appointment. Please note that routine referrals can sometimes take up to 3-4 weeks to process. Please call our office if you haven't heard anything after this time frame.  Take care,  Tinnie Harada, NP

## 2023-11-18 ENCOUNTER — Encounter: Payer: Self-pay | Admitting: Nurse Practitioner

## 2023-12-16 ENCOUNTER — Telehealth: Admitting: Nurse Practitioner

## 2023-12-16 ENCOUNTER — Encounter: Payer: Self-pay | Admitting: Nurse Practitioner

## 2023-12-16 DIAGNOSIS — G43709 Chronic migraine without aura, not intractable, without status migrainosus: Secondary | ICD-10-CM | POA: Diagnosis not present

## 2023-12-16 MED ORDER — TOPIRAMATE 25 MG PO TABS: 25.0000 mg | ORAL_TABLET | Freq: Two times a day (BID) | ORAL | 3 refills | Status: AC

## 2023-12-16 NOTE — Patient Instructions (Signed)
 It was great to see you!  Keep taking topamax  twice a day and sumatriptan  as needed  Let's follow-up if symptoms worsen or any concerns.   Take care,  Tinnie Harada, NP

## 2023-12-16 NOTE — Progress Notes (Signed)
 [facility] [address] [place] [address] Dept: [phone] Dept Fax: [phone]  Virtual Video Visit  I connected with Ryan Woods on 12/16/23 at  3:20 PM EDT by a video enabled telemedicine application and verified that I am speaking with the correct person using two identifiers.  Location patient: Home Location provider: Clinic Persons participating in the virtual visit: Patient; Ryan Harada, NP; Laymon Gladis Sharps, CMA  I discussed the limitations of evaluation and management by telemedicine and the availability of in person appointments. The patient expressed understanding and agreed to proceed.  Chief Complaint  Patient presents with   Migraine    Pt wants to discuss about his migraines, states he hasn't had many issue since.     SUBJECTIVE:  HPI:  Discussed the use of AI scribe software for clinical note transcription with the patient, who gave verbal consent to proceed.  History of Present Illness   PELLEGRINO Woods is a 28 year old male who presents for follow-up of his headache management.  He has experienced significant improvement in his headaches since the last visit. He had a migraine yesterday, the first in several weeks, and used Imitrex , which helped him sleep and eased the migraine. This was the second migraine since his last visit.  He is currently taking Topamax , one pill twice a day, with no side effects. Since starting this medication, he has not experienced headaches or migraines during intercourse.     Patient Active Problem List   Diagnosis Date Noted   B12 deficiency 06/20/2023   Chest pain 05/30/2023   Primary hypertension 04/11/2022   Varicocele 08/07/2021   Routine general medical examination at a health care facility 08/09/2012   Migraine 04/07/2012   Back pain, lumbosacral 04/07/2012   Anxiety and depression 04/07/2012    Past Surgical History:  Procedure  Laterality Date   APPENDECTOMY      Family History  Problem Relation Age of Onset   Heart disease Mother    Diabetes Father    Hyperlipidemia Father    Hypertension Father    Arthritis Father    Thyroid disease Father    Cancer Maternal Grandmother        throat   Cancer Maternal Grandfather        lung   Heart disease Maternal Grandfather    Thyroid disease Paternal Grandmother    Heart disease Paternal Grandfather     Social History   Tobacco Use   Smoking status: Former    Current packs/day: 0.00    Types: Cigarettes    Quit date: 2015    Years since quitting: 10.7   Smokeless tobacco: Never  Vaping Use   Vaping status: Never Used  Substance Use Topics   Alcohol use: Yes    Comment: occasionally   Drug use: No     Current Outpatient Medications:    buPROPion  (WELLBUTRIN ) 100 MG tablet, Take 1 tablet (100 mg total) by mouth 2 (two) times daily., Disp: 180 tablet, Rfl: 3   hydrOXYzine  (VISTARIL ) 25 MG capsule, TAKE 1 CAPSULE BY MOUTH EVERY 8 HOURS AS NEEDED FOR ANXIETY, Disp: 30 capsule, Rfl: 0   lisinopril  (ZESTRIL ) 40 MG tablet, Take 1 tablet (40 mg total) by mouth daily., Disp: 90 tablet, Rfl: 3   ondansetron  (ZOFRAN ) 4 MG tablet, Take 1 tablet (4 mg total) by mouth every 8 (eight) hours as needed., Disp: 30 tablet, Rfl: 0   SUMAtriptan  (IMITREX ) 50 MG tablet,  Take 1 tablet (50 mg total) by mouth every 2 (two) hours as needed for migraine. May repeat in 2 hours if headache persists or recurs., Disp: 10 tablet, Rfl: 0   topiramate  (TOPAMAX ) 25 MG tablet, Take 1 tablet (25 mg total) by mouth 2 (two) times daily. Start 1 tablet daily for 7 days, then increase to twice a day, Disp: 180 tablet, Rfl: 3  No Known Allergies  ROS: See pertinent positives and negatives per HPI.  OBSERVATIONS/OBJECTIVE:  VITALS per patient if applicable: There were no vitals filed for this visit. There is no height or weight on file to calculate BMI.    GENERAL: Alert and oriented.  Appears well and in no acute distress.  HEENT: Atraumatic. Conjunctiva clear. No obvious abnormalities on inspection of external nose and ears.  NECK: Normal movements of the head and neck.  LUNGS: On inspection, no signs of respiratory distress. Breathing rate appears normal. No obvious gross SOB, gasping or wheezing, and no conversational dyspnea.  CV: No obvious cyanosis.  MS: Moves all visible extremities without noticeable abnormality.  PSYCH/NEURO: Pleasant and cooperative. No obvious depression or anxiety. Speech and thought processing grossly intact.  ASSESSMENT AND PLAN:  Problem List Items Addressed This Visit       Cardiovascular and Mediastinum   Migraine - Primary   Chronic, stable. Migraines have improved with current treatment. He experienced one episode since the last visit, managed effectively with Imitrex . No side effects from Topamax  and no migraines associated with intercourse. Continue Topamax  25 mg twice daily and refill the prescription. Use Imitrex  as needed for acute episodes. Consider MRI if symptoms worsen.       Relevant Medications   topiramate  (TOPAMAX ) 25 MG tablet     I discussed the assessment and treatment plan with the patient. The patient was provided an opportunity to ask questions and all were answered. The patient agreed with the plan and demonstrated an understanding of the instructions.   The patient was advised to call back or seek an in-person evaluation if the symptoms worsen or if the condition fails to improve as anticipated.   Ryan DELENA Harada, NP

## 2023-12-16 NOTE — Assessment & Plan Note (Signed)
 Chronic, stable. Migraines have improved with current treatment. He experienced one episode since the last visit, managed effectively with Imitrex . No side effects from Topamax  and no migraines associated with intercourse. Continue Topamax  25 mg twice daily and refill the prescription. Use Imitrex  as needed for acute episodes. Consider MRI if symptoms worsen.

## 2023-12-23 ENCOUNTER — Encounter: Payer: Self-pay | Admitting: Nurse Practitioner

## 2024-01-08 ENCOUNTER — Ambulatory Visit: Admitting: Urology

## 2024-01-08 VITALS — BP 143/77 | HR 87 | Ht 72.0 in | Wt 180.7 lb

## 2024-01-08 DIAGNOSIS — I861 Scrotal varices: Secondary | ICD-10-CM | POA: Diagnosis not present

## 2024-01-08 DIAGNOSIS — N469 Male infertility, unspecified: Secondary | ICD-10-CM | POA: Diagnosis not present

## 2024-01-08 NOTE — Patient Instructions (Addendum)
 Varicocele  A varicocele is a swelling of veins in the scrotum. The scrotum is the sac that contains the testicles. Varicoceles can occur on either side of the scrotum, but they are more common on the left side. They occur most often in teenage boys and young men. In most cases, varicoceles are not a serious problem. They are usually small and painless and do not require treatment. Tests may be done to confirm the diagnosis. Treatment may be needed if: A varicocele is large, causes a lot of pain, or causes pain when exercising. Varicoceles are found on both sides of the scrotum. A varicocele causes a decrease in the size of the testicle in a growing adolescent. A varicocele makes it hard to get someone pregnant (infertility). What are the causes? This condition is caused by valves in the veins not working properly. Valves in the veins help to return blood from the scrotum and testicles to the heart. If these valves do not work well, blood flows backward and backs up into the veins, which causes the veins to swell. This is similar to what happens when varicose veins form in the leg. What are the signs or symptoms? Most varicoceles do not cause any symptoms. If symptoms do occur, they may include: Swelling on one side of the scrotum. The swelling may be more obvious when you are standing up. A lumpy feeling in the scrotum. A heavy feeling on one side of the scrotum. A dull ache in the scrotum, especially after exercise or prolonged standing or sitting. Slower growth or reduced size of the testicle on the side of the varicocele. This happens in young males. Infertility. This can occur if the testicle does not grow normally or if the condition causes problems with the sperm, such as a low sperm count or sperm that are not able to reach the egg. How is this diagnosed? This condition is diagnosed based on: Your medical history. A physical exam. Your health care provider may check and feel the scrotum  area to check for swollen or enlarged veins. An ultrasound. This may be done to confirm the diagnosis and to help rule out other causes of the swelling. How is this treated? Treatment is usually not needed for this condition. If you have any pain, your provider may give you medicine to help relieve it. Your provider may need to do tests to make sure that your varicocele does not cause problems. If further treatment is needed, you may have one of these procedures: Varicocelectomy. This is surgery to tie off the swollen veins so that the flow of blood goes to other veins instead. Embolization. This procedure uses a soft tube (catheter) to place metal coils or other devices to block the veins. This cuts off the blood flow to the swollen veins. Follow these instructions at home: Take over-the-counter and prescription medicines only as told by your provider. Wear supportive underwear. Use an athletic supporter when doing sports activities. Contact a health care provider if: Your pain is increasing. You have redness in the affected area. Your testicle is enlarged, swollen, or painful. You have swelling that does not get better when you are lying down. One of your testicles is smaller than the other. You develop swelling in your legs. Get help right away if: You have difficulty breathing. This symptom may be an emergency. Get help right away. Call 911. Do not wait to see if the symptom will go away. Do not drive yourself to the hospital. This information  is not intended to replace advice given to you by your health care provider. Make sure you discuss any questions you have with your health care provider. Document Revised: 11/15/2021 Document Reviewed: 11/15/2021 Elsevier Patient Education  2024 [employer].

## 2024-01-08 NOTE — Progress Notes (Signed)
   01/08/24 4:22 PM   Ryan Woods [birth year] [id number]  CC: Left varicocele, fertility concerns  HPI: 28 year old male today here with his wife for the above issues.  He reports he has had a large left-sided varicocele for at least 5 years, previously was followed by Dr. Gala but never underwent intervention.  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  Those records are not available to me.  He has chronic dull aching pain in the left scrotum, especially at the end of the day.  He is interested in definitive treatment options for his varicocele, as well as fertility options.  He denies any urinary symptoms.  He has no biologic pregnancies.  His wife was reportedly evaluated by GYN for fertility and her workup was negative.   PMH: Past Medical History:  Diagnosis Date   Anxiety    Migraine     Surgical History: Past Surgical History:  Procedure Laterality Date   APPENDECTOMY     Family History: Family History  Problem Relation Age of Onset   Heart disease Mother    Diabetes Father    Hyperlipidemia Father    Hypertension Father    Arthritis Father    Thyroid disease Father    Cancer Maternal Grandmother        throat   Cancer Maternal Grandfather        lung   Heart disease Maternal Grandfather    Thyroid disease Paternal Grandmother    Heart disease Paternal Grandfather     Social History:  reports that he quit smoking about 10 years ago. His smoking use included cigarettes. He has never used smokeless tobacco. He reports current alcohol use. He reports that he does not use drugs.  Physical Exam: BP (!) 143/77 (BP Location: Left Arm, Patient Position: Sitting, Cuff Size: Normal)   Pulse 87   Ht 6' (1.829 m)   Wt 180 lb 11.2 oz (82 kg)   SpO2 97%   BMI 24.51 kg/m    Constitutional:  Alert and oriented, No acute distress. Cardiovascular: No clubbing, cyanosis, or edema. Respiratory: Normal respiratory effort, no  increased work of breathing. GI: Abdomen is soft, nontender, nondistended, no abdominal masses GU: Large grade 3 left varicocele, testicles 20 cc without masses   Pertinent Imaging: I have personally viewed and interpreted the scrotal ultrasound June 2023 Showing large left-sided varicocele.  Assessment & Plan:   28 year old male with large left grade 3 varicocele, Chronic left-sided testicular pain and discomfort, and infertility with no prior biologic pregnancies despite 5 years of unprotected sexual activity.  His wife has undergone fertility workup with GYN which was negative.  He is interested in treatment options.  We discussed the impact of varicoceles on fertility.  I recommended repeating semen analysis since it has been a few years since he had one done.  We discussed that even with varicocele repair he may still have challenges with fertility that could require other options including in vitro fertilization or ICSI.  He is interested in varicocelectomy.  Risk and benefits were discussed.  Will coordinate follow-up with Dr. Georganne who performs this surgery and will discuss procedure in more detail as well as the risks and benefits.  Semen analysis for fertility workup Follow-up with Dr. Georganne to discuss varicocelectomy  Redell Burnet, MD 01/08/2024  [facility] [address], [address] [place], [place] [address] [phone]

## 2024-01-10 ENCOUNTER — Encounter: Payer: Self-pay | Admitting: Nurse Practitioner

## 2024-01-13 ENCOUNTER — Other Ambulatory Visit

## 2024-01-13 ENCOUNTER — Other Ambulatory Visit: Payer: Self-pay

## 2024-01-13 DIAGNOSIS — N469 Male infertility, unspecified: Secondary | ICD-10-CM

## 2024-01-13 NOTE — Addendum Note (Signed)
 Addended by: Arwilda Georgia A on: 01/13/2024 04:26 PM   Modules accepted: Orders

## 2024-01-20 NOTE — Progress Notes (Unsigned)
   01/23/2024 7:43 AM   Ryan Woods Ada 04/07/1995 [id number]  Reason for visit: Follow up Left varicocele    HPI: 28 y.o. male, initial follow up with me today, previously seen by Dr. Francisca in Nov 2025  Prior HPI: 5-yr hx of large left varicocele Ongoing infertility with SA showing low motility (we do not have SA records)  5+yr of infertility, wife has undergone negative Gyn workup Chronic dull left hemiscrotal achiness    Physical Exam: There were no vitals taken for this visit.   Constitutional:  Alert and oriented, No acute distress. GU: ***  Laboratory Data: N/A  Pertinent Imaging: Scrotal US  (2023) - left varicocele    Assessment & Plan:    Left varicocele Assessment & Plan: Chronic Left varicocele w/ symptoms + infertility w/ abnormal SA   Varicocele is a dilatation of the spermatic cord veins, diagnosed by physical exam and/or confirmation with scrotal US . It can contribute to chronic scrotal pain syndromes, though many remain incidental and asymptomatic. Management ranges from conservative measures to surgical repair for classic, persistent pain, with microsurgical subinguinal repair showing the highest success rates. Varicocele repair is considered in infertile men with abnormal semen analyses, but not routinely performed in healthy men for this indication alone.    - Good candidate for left varicocele repair for pain + documented infertility - He will need to follow up with a male infertility specialist vs comprehensive REI center        Ryan JONELLE Skye, MD  [facility] [address] [place], [place] [address] [phone]

## 2024-01-20 NOTE — Assessment & Plan Note (Signed)
 Chronic Left varicocele w/ symptoms + infertility w/ abnormal SA   Varicocele is a dilatation of the spermatic cord veins, diagnosed by physical exam and/or confirmation with scrotal US . It can contribute to chronic scrotal pain syndromes, though many remain incidental and asymptomatic. Management ranges from conservative measures to surgical repair for classic, persistent pain, with microsurgical subinguinal repair showing the highest success rates. Varicocele repair is considered in infertile men with abnormal semen analyses, but not routinely performed in healthy men for this indication alone.    - Good candidate for left varicocele repair for pain + documented infertility - However, I did mention that he ultimately need an evaluation and comprehensive treatment through an REI center.  It would be his decision to postpone surgery until this evaluation (as well as interval semen analysis, which she has had a difficult time getting)  - He would discussed with his wife and let me know.  I would be happy to offer a microscopic left varicocelectomy for pain

## 2024-01-23 ENCOUNTER — Ambulatory Visit: Admitting: Urology

## 2024-01-23 VITALS — BP 126/76 | HR 80 | Ht 72.0 in | Wt 180.0 lb

## 2024-01-23 DIAGNOSIS — I861 Scrotal varices: Secondary | ICD-10-CM

## 2024-01-23 DIAGNOSIS — N469 Male infertility, unspecified: Secondary | ICD-10-CM

## 2024-02-05 ENCOUNTER — Other Ambulatory Visit: Payer: Self-pay | Admitting: Urology

## 2024-02-05 DIAGNOSIS — I861 Scrotal varices: Secondary | ICD-10-CM

## 2024-02-05 NOTE — Progress Notes (Signed)
 Patient elected to proceed with varicocele repair. Will request OR date for: Left microscopic subinguinal varicocelectomy

## 2024-02-07 ENCOUNTER — Other Ambulatory Visit: Payer: Self-pay

## 2024-02-07 DIAGNOSIS — I861 Scrotal varices: Secondary | ICD-10-CM

## 2024-02-07 NOTE — Progress Notes (Signed)
 Surgical Physician Order Form [facility] [place]  Dr. Penne Skye, MD  * Scheduling expectation : ~Jan/Feb 2026  *Length of Case: 2 hours  *Clearance needed: no  *Anticoagulation Instructions: N/A  *Aspirin Instructions: N/A  *Post-op visit Date/Instructions:  1 month follow up  *Diagnosis: Left varicocele  *Procedure: Left microscopic varicocelectomy   Additional orders: Need surgical microscope (borrow from ENT)  -Admit type: OUTpatient  -Anesthesia: General  -VTE Prophylaxis Standing Order SCD's       Other:   -Standing Lab Orders Per Anesthesia    Lab other: None  -Standing Test orders EKG/Chest x-ray per Anesthesia       Test other:   - Medications:  Ancef 2gm IV  -Other orders:  N/A

## 2024-03-02 NOTE — Progress Notes (Signed)
 OR is not able to accommodate Microscopic surgeries at this time per Dr. Georganne. Patient has been advised to follow up with Fertility Clinic.

## 2024-03-06 NOTE — Addendum Note (Signed)
 Addended by: GEORGANNE PENNE SAUNDERS on: 03/06/2024 06:57 AM   Modules accepted: Orders

## 2024-04-06 ENCOUNTER — Other Ambulatory Visit: Payer: Self-pay | Admitting: Internal Medicine

## 2024-04-06 DIAGNOSIS — I10 Essential (primary) hypertension: Secondary | ICD-10-CM

## 2024-11-16 ENCOUNTER — Encounter: Admitting: Nurse Practitioner

## 2024-11-23 ENCOUNTER — Encounter: Admitting: Nurse Practitioner
